# Patient Record
Sex: Female | Born: 1975 | Hispanic: Yes | Marital: Married | Smoking: Never smoker
Health system: Southern US, Community
[De-identification: ages and names within clinical notes are randomized; demographics above are authoritative.]

## PROBLEM LIST (undated history)

## (undated) DIAGNOSIS — M542 Cervicalgia: Secondary | ICD-10-CM

## (undated) DIAGNOSIS — G8929 Other chronic pain: Secondary | ICD-10-CM

## (undated) DIAGNOSIS — K2990 Gastroduodenitis, unspecified, without bleeding: Secondary | ICD-10-CM

## (undated) DIAGNOSIS — R109 Unspecified abdominal pain: Secondary | ICD-10-CM

## (undated) HISTORY — DX: Unspecified abdominal pain: R10.9

## (undated) HISTORY — DX: Other chronic pain: G89.29

---

## 2012-11-06 ENCOUNTER — Ambulatory Visit: Payer: Self-pay | Admitting: Gastroenterology

## 2013-01-21 ENCOUNTER — Encounter: Payer: Self-pay | Admitting: Internal Medicine

## 2013-02-05 ENCOUNTER — Encounter: Payer: Self-pay | Admitting: Gastroenterology

## 2013-02-05 ENCOUNTER — Ambulatory Visit (INDEPENDENT_AMBULATORY_CARE_PROVIDER_SITE_OTHER): Payer: Self-pay | Admitting: Gastroenterology

## 2013-02-05 VITALS — BP 105/74 | HR 64 | Temp 98.4°F | Ht 66.0 in | Wt 174.4 lb

## 2013-02-05 DIAGNOSIS — R109 Unspecified abdominal pain: Secondary | ICD-10-CM

## 2013-02-05 MED ORDER — OMEPRAZOLE 20 MG PO CPDR: 20.0000 mg | DELAYED_RELEASE_CAPSULE | Freq: Every day | ORAL | Status: DC

## 2013-02-05 NOTE — Progress Notes (Signed)
Primary Care Physician:  Tylene Fantasia., PA-C Primary Gastroenterologist:  Dr. Darrick Penna   Chief Complaint  Patient presents with  . Abdominal Pain    HPI:    [name] presents today at the request of Dr. Malvin Johns secondary to abdominal pain. She is Hispanic, and an interpreter is present for translating. She comes with records from a CT scan, pelvis ultrasound, and transvaginal ultrasound performed last year. CT only noted small umbilical hernia containing omental fat. Both ultrasounds unremarkable. UA normal from earlier this month, CBC normal.   States abdominal pain started on January 17, 2012. Intermittent, "deep pain", mid-abdominal/diffusely across abdomen. No vomiting, diarrhea. Rare episodes of constipation. No association with bowel habits. No association with eating. Notes decreased appetite with pain. Bloating across abdomen.   Then June 18, October 29, Nov 1, calm for 3 months, Feb 1. Will take Bentyl with improvement. No hematochezia, no melena. Good appetite, no significant weight loss.  No N/V, fever/chills. No GERD.   No past medical history on file.  Past Surgical History  Procedure Laterality Date  . Cesarean section      Current Outpatient Prescriptions  Medication Sig Dispense Refill  . dicyclomine (BENTYL) 20 MG tablet Take 20 mg by mouth every 6 (six) hours.      Marland Kitchen omeprazole (PRILOSEC) 20 MG capsule Take 1 capsule (20 mg total) by mouth daily.  30 capsule  3   No current facility-administered medications for this visit.    Allergies as of 02/05/2013  . (No Known Allergies)    Family History  Problem Relation Age of Onset  . Colon cancer Neg Hx     History   Social History  . Marital Status: Unknown    Spouse Name: N/A    Number of Children: N/A  . Years of Education: N/A   Occupational History  . Not on file.   Social History Main Topics  . Smoking status: Never Smoker   . Smokeless tobacco: Not on file  . Alcohol Use: No  . Drug  Use: No  . Sexually Active: Not on file   Other Topics Concern  . Not on file   Social History Narrative  . No narrative on file    Review of Systems: Gen: Denies any fever, chills, fatigue, weight loss, lack of appetite.  CV: Denies chest pain, heart palpitations, peripheral edema, syncope.  Resp: Denies shortness of breath at rest or with exertion. Denies wheezing or cough.  GI: Denies dysphagia or odynophagia. Denies jaundice, hematemesis, fecal incontinence. GU : Denies urinary burning, urinary frequency, urinary hesitancy MS: Denies joint pain, muscle weakness, cramps, or limitation of movement.  Derm: Denies rash, itching, dry skin Psych: Denies depression, anxiety, memory loss, and confusion Heme: Denies bruising, bleeding, and enlarged lymph nodes.  Physical Exam: BP 105/74  Pulse 64  Temp(Src) 98.4 F (36.9 C) (Oral)  Ht 5\' 6"  (1.676 m)  Wt 174 lb 6.4 oz (79.107 kg)  BMI 28.16 kg/m2  LMP 01/14/2013 General:   Alert and oriented. Pleasant and cooperative. Well-nourished and well-developed.  Head:  Normocephalic and atraumatic. Eyes:  Without icterus, sclera clear and conjunctiva pink.  Ears:  Normal auditory acuity. Nose:  No deformity, discharge,  or lesions. Mouth:  No deformity or lesions, oral mucosa pink.  Neck:  Supple, without mass or thyromegaly. Lungs:  Clear to auscultation bilaterally. No wheezes, rales, or rhonchi. No distress.  Heart:  S1, S2 present without murmurs appreciated.  Abdomen:  +BS, soft, non-tender and non-distended.  No HSM noted. No guarding or rebound. No masses appreciated.  Rectal:  Deferred  Msk:  Symmetrical without gross deformities. Normal posture. Extremities:  Without clubbing or edema. Neurologic:  Alert and  oriented x4;  grossly normal neurologically. Skin:  Intact without significant lesions or rashes. Cervical Nodes:  No significant cervical adenopathy. Psych:  Alert and cooperative. Normal mood and affect.

## 2013-02-05 NOTE — Patient Instructions (Addendum)
We will mail you the stool container to use for the stool sample.  Start taking Prilosec each morning, 30 minutes before breakfast. This is for your stomach.  Please complete the blood work once you have gotten the financial assistance through the hospital.  We will see you back in 4 weeks!

## 2013-02-06 DIAGNOSIS — R109 Unspecified abdominal pain: Secondary | ICD-10-CM | POA: Insufficient documentation

## 2013-02-06 NOTE — Assessment & Plan Note (Addendum)
Pleasant Spanish-speaking 37 year old female, presenting at the request of Dr. Malvin Johns due to diffuse abdominal pain, intermittent in pattern. It is interesting she is able to tell me exact calendar dates when pain occurs, but this is described as diffuse and without any aggravating or alleviating factors. There is no association with bowel habits, eating, drinking. No hematochezia or melena. Abdominal bloating associated with pain. No upper GI symptoms. Thus far, CT and pelvic ultrasounds unrevealing. Unclear etiology with differentials to include gastritis, possible biliary etiology, question a component of IBS although she does not note any association with bowel habits.   Check celiac serologies Ifobt Obtain financial assistance  Start Prilosec daily Consider [place] of abdomen to further evaluate gallbladder Monitor for any pattern with pain 4 week f/u with Dr. Darrick Penna.

## 2013-02-10 NOTE — Progress Notes (Signed)
Cc PCP 

## 2013-02-19 ENCOUNTER — Ambulatory Visit (INDEPENDENT_AMBULATORY_CARE_PROVIDER_SITE_OTHER): Payer: Self-pay | Admitting: Gastroenterology

## 2013-02-19 DIAGNOSIS — R109 Unspecified abdominal pain: Secondary | ICD-10-CM

## 2013-02-19 LAB — IFOBT (OCCULT BLOOD): IFOBT: POSITIVE

## 2013-02-27 NOTE — Progress Notes (Signed)
Quick Note:  Called. VM not set up. Mailed letter for pt to call. ______

## 2013-02-27 NOTE — Progress Notes (Signed)
Quick Note:  Heme positive.  Recommend colonoscopy (+/- EGD if evidence of dyspepsia). Pt needs to keep appt with Dr. Darrick Penna in June.  Complete celiac serologies as requested. ______

## 2013-02-28 ENCOUNTER — Other Ambulatory Visit: Payer: Self-pay | Admitting: Gastroenterology

## 2013-02-28 NOTE — Progress Notes (Signed)
I attempted to schedule but when I called the home no one there could speak English, so I have mailed a letter to the patient in hopes that someone that speaks English will call back to get her scheduled

## 2013-02-28 NOTE — Progress Notes (Signed)
Quick Note:  LATE ENTRY: Pt's niece , Josephina Gip, called about 4:45 yesterday afternoon. I informed her of the plan. She said she did not know the patient was supposed to do labs and so I faxed the orders to [facility] and she will see that she gets those done. She said that the pt was aware that she needed the colonoscopy and maybe an EGD. She said ok to schedule and She will be the one to take her. ______

## 2013-03-03 NOTE — Progress Notes (Signed)
Quick Note:  Gabriella Garcia mailed a letter for pt to call to get scheduled. ______

## 2013-03-04 ENCOUNTER — Other Ambulatory Visit: Payer: Self-pay | Admitting: Gastroenterology

## 2013-03-04 NOTE — Progress Notes (Signed)
Patients niece called and stated that Gabriella Garcia was waiting to hear back about her financial assistance before scheduling TCS +/-EGD

## 2013-03-27 ENCOUNTER — Ambulatory Visit (INDEPENDENT_AMBULATORY_CARE_PROVIDER_SITE_OTHER): Payer: Self-pay | Admitting: Gastroenterology

## 2013-03-27 ENCOUNTER — Encounter: Payer: Self-pay | Admitting: Gastroenterology

## 2013-03-27 VITALS — BP 109/74 | HR 69 | Temp 97.4°F | Ht 68.0 in | Wt 178.4 lb

## 2013-03-27 DIAGNOSIS — R1013 Epigastric pain: Secondary | ICD-10-CM

## 2013-03-27 DIAGNOSIS — G8929 Other chronic pain: Secondary | ICD-10-CM

## 2013-03-27 MED ORDER — OMEPRAZOLE 20 MG PO CPDR: DELAYED_RELEASE_CAPSULE | ORAL | Status: DC

## 2013-03-27 NOTE — Progress Notes (Signed)
  Subjective:    Patient ID: Gabriella Garcia, female    DOB: [birth year], 37 y.o.   MRN: [id number]  MUSE,ROCHELLE D., PA-C  INFORMATION OBTAINED VIA INTERPRETER-PT DOES NOT SPEAK ENGLISH.  HPI C/o bloating still. Pills didn't help. She took meds but didn't see a difference. BMs: 2-3 TIMES A DAY(#3). GOES TO SLEEP AND WAKES UP AND AROUND 2 PM IT STARTS. SHE DOESN'T FEEL LIKE SHE NEEDS TO LAY DOWN. BEEN IN [place] FOR 6 YEARS. BORN IN [place]. MILK: NONE CHEESE: NO. ICE CREAM: EVERY 8 DAYS. WEIGHT GAIN: 154 LBS 5 YEARS AGO AND 178 LBS. NO ASPIRIN, BC, GOODYS, IBUPROFEN/MORTIN, OR NAPROXEN/ALEVE OR ETOH. HAD A BABY/CSXN NOV 2012 AND 5 MOS LATER SHE HAD THE PAIN. NO WORK SINCE 2012 DUE TO CHILD BIRTH AND ABDOMINAL BLOATING  PT DENIES FEVER, CHILLS, BRBPR, nausea, vomiting, melena, diarrhea, constipation, abd pain, problems swallowing, problems with sedation, heartburn or indigestion.  Past medical history: NONE.  Past Surgical History  Procedure Laterality Date  . Cesarean section     No Known Allergies  Current Outpatient Prescriptions  Medication Sig Dispense Refill  . dicyclomine (BENTYL) 20 MG tablet Take 20 mg by mouth every 6 (six) hours.      Marland Kitchen omeprazole (PRILOSEC) 20 MG capsule Take 1 capsule (20 mg total) by mouth daily.     Family History  Problem Relation Age of Onset  . Colon cancer Neg Hx    History  Substance Use Topics  . Smoking status: Never Smoker   . Smokeless tobacco: Not on file  . Alcohol Use: No     Review of Systems PER HPI OTHERWISE ALL SYSTEMS ARE NEGATIVE.     Objective:   Physical Exam  Vitals reviewed. Constitutional: She is oriented to person, place, and time. She appears well-nourished. No distress.  HENT:  Head: Normocephalic and atraumatic.  Mouth/Throat: Oropharynx is clear and moist. No oropharyngeal exudate.  Eyes: Pupils are equal, round, and reactive to light. No scleral icterus.  Neck: Normal range of motion. Neck supple.   Cardiovascular: Normal rate, regular rhythm and normal heart sounds.   Pulmonary/Chest: Effort normal and breath sounds normal. No respiratory distress.  Abdominal: Soft. Bowel sounds are normal. She exhibits no distension. There is tenderness.  MILD TTP IN THE EPIGASTRIUM    Musculoskeletal: Normal range of motion. She exhibits no edema.  Lymphadenopathy:    She has no cervical adenopathy.  Neurological: She is alert and oriented to person, place, and time.  NO FOCAL DEFICITS   Psychiatric: She has a normal mood and affect.          Assessment & Plan:

## 2013-03-27 NOTE — Patient Instructions (Addendum)
YOUR PAIN IS MOST LIKELY DUE TO GASTRITIS.   Stop taking dicyclomine if it does not help your pain. USE TYLENOL AS NEEDED FOR PAIN  START OMEPRAZOLE TWICE DAILY AND CONTINUE FOR AT LEAST 3 MOS.  FOLLOW A  Dieta para el control del colesterol y las grasas . SEE INFO BELOW.  YOU NEED AN UPPER ENDOSCOPY. WE WILL SCHEDULE TODAY FOR 2 WEEKS FROM NOW.  FOLLOW UP IN 2 MOS.   Gastritis - Adultos  (Gastritis, Adult)  La gastrittis es la irritacin (inflamacin) de la membrana interna del estmago. Puede ser Neomia Dear enfermedad de inicio sbito (aguda) o de largo plazo (crnica). Si la gastritis no se trata, puede causar sangrado y lceras. CAUSAS  La gastritis se produce cuando la membrana que tapiza interiormente al estmago se debilita o se daa. Los jugos digestivos del estmago inflaman el revestimiento del estmago debilitado. El revestimiento del estmago puede debilitarse o daarse por una infeccin viral o bacteriana. La infeccin bacteriana ms comn es la infeccin por Helicobacter pylori. Tambin puede ser el resultado del consumo excesivo de alcohol, por el uso de ciertos medicamentos o porque hay demasiado cido en el estmago.  SNTOMAS  En algunos casos no hay sntomas. Si se presentan sntomas, stos pueden ser:   Dolor o sensacin de ardor en la parte superior del abdomen.  Nuseas.  Vmitos.  Sensacin molesta de distensin despus de comer. DIAGNSTICO  El mdico puede diagnosticar gastritis segn los sntomas y el examen fsico. Para determinar la causa de la gastritis, el mdico podr:   Pedir anlisis de sangre o de materia fecal para diagnosticar la presencia de la bacteria H pylori.  Gastroscopa. Un tubo delgado y flexible (endoscopio) se pasa por Theatre stage manager al [employer]. El endoscopio tiene [place] luz y una cmara en el extremo. El mdico utilizar el endoscopio para observar el interior del [place].  Tomar una muestra de tejido (biopsia) del estmago para  examinarlo en el microscopio. TRATAMIENTO  Segn la causa de la gastritis podrn recetarle: Antibiticos, si la causa es una infeccin bacteriana, como una infeccin por H. pylori. Anticidos o bloqueadores H2, si hay demasiado cido en el estmago. El [employer] aconsejar que deje de tomar aspirina, ibuprofeno u otros antiinflamatorios no esteroides (AINE).  INSTRUCCIONES PARA EL CUIDADO EN EL HOGAR   Tome slo medicamentos de venta libre o recetados, segn las indicaciones del mdico.  Si le han recetado antibiticos, tmelos segn las indicaciones. Tmelos todos, aunque se sienta mejor.  Debe ingerir gran cantidad de lquido para mantener la orina de tono claro o color amarillo plido.  Evite las comidas y bebidas que [address] [place], [place]:  [place] con cafena o alcohlicas.  Chocolate.  Sabores a Advertising account planner.  Ajo y cebolla.  Comidas muy condimentadas.  Ctricos como naranjas, limones o limas.  Alimentos que contengan tomate, como salsas, [place] y pizza.  Alimentos fritos y Lexicographer.  Haga comidas pequeas durante Glass blower/designer de 3 comidas abundantes. SOLICITE ATENCIN MDICA DE INMEDIATO SI:   La materia fecal es negra o de color rojo oscuro.  Vomita sangre de color rojo brillante o material similar a granos de caf.  No puede retener los lquidos.  El dolor abdominal empeora.  Tiene fiebre.  No mejora luego de 1 semana.  Tiene preguntas o preocupaciones. ASEGRESE DE QUE:   Comprende estas instrucciones.  Controlar su enfermedad.  Solicitar ayuda de inmediato si no mejora o si empeora. Document Released: 07/12/2005 Document Revised: 06/26/2012 ExitCare  Patient Information 2014 [place], [place].     Dieta para el control del colesterol y las grasas  (Fat and Cholesterol Control Diet) El colesterol es una sustancia similar a la cera. Se forma en el hgado y se encuentra en ciertos alimentos. Hay colesterol bueno (HDL) y colesterol malo (LDL). El  exceso de colesterol en la sangre puede afectar su corazn. Ciertos alimentos pueden disminuir o aumentar el nivel de colesterol. Consuma alimentos bajos en colesterol.  Las grasas saturadas y trans son grasas malas que se encuentran en los alimentos y elevan el nivel de colesterol. No  Consuma alimentos altos en grasas saturadas y grasas trans.  ALIMENTOS CON ALTO CONTENIDO EN GRASAS SATURADAS Y GRASAS TRANS   Los productos lcteos como la [address] entera [place], [place], [place], crema y [place].  Carnes grasas, tales como chorizos, salchichas y salami.  Comidas fritas.  Las grasas trans que se encuentran en la margarina y las galletas pre hechas, galletitas y productos de [place].  Los [employer], como los aceites de coco y de [place]. Lea la etiqueta del envase en la tienda. No compre productos que contengan grasas saturadas, grasas trans o aceites hidrogenados. Busque alimentos rotulados como:   Nature conservation officer.  Bajo en grasas saturadas.  Libre de [place] trans.  Bajo en colesterol. ALIMENTOS QUE DISMINUYEN EL COLESTEROL   Frutas.  Vegetales.  Porotos, guisantes secos y lentejas.  Pescado.  Carne magra como pollo (sin piel) o carne molida de [place].  Granos como cebada, arroz, cuscus, trigo bulgur y pastas.  Margarina de tubo saludable para el corazn. PREPARE SUS ALIMENTOS USTED MISMO   Cocine los alimentos hervidos, horneados, al vapor o asados. No cocine los alimentos en fritura.  Utilice un spray antiadherente para cocinar.  Use limn o hierbas para condimentar comidas en lugar de usar mantequilla o margarina.  Use yogur descremados, salsas o aderezos para ensaladas con bajo contenido de [place]. Document Released: 04/02/2012 [facility] Patient Information 2014 [place], [place].

## 2013-03-28 DIAGNOSIS — R1013 Epigastric pain: Secondary | ICD-10-CM | POA: Insufficient documentation

## 2013-03-28 DIAGNOSIS — G8929 Other chronic pain: Secondary | ICD-10-CM | POA: Insufficient documentation

## 2013-03-28 NOTE — Assessment & Plan Note (Addendum)
MOST LIKELY DUE TO H PYLORI GASTRITIS, DOUBT GASTRIC CA, MALT LYMPHOMA, OR EOSINOPHILIC GASTROENETRITIS  I REVIEWED IMAGING FROM 2013 TO PRESENT.  Stop taking dicyclomine if it does not help your pain. USE TYLENOL AS NEEDED FOR PAIN START OMEPRAZOLE TWICE DAILY AND CONTINUE FOR AT LEAST 3 MOS. FOLLOW A  Dieta para el control del colesterol y las grasas . SEE INFO BELOW. UPPER ENDOSCOPY IN 2 WEEKS FROM NOW. WILL CHECK ON [facility].  FOLLOW UP IN 2 MOS.

## 2013-03-28 NOTE — Progress Notes (Signed)
CC PCP 

## 2013-03-31 ENCOUNTER — Telehealth: Payer: Self-pay

## 2013-03-31 NOTE — Telephone Encounter (Signed)
Barbara from [facility] left VM that pt's application has expired and needs to be updated. I have tried to call Britta Mccreedy back and the lines are so busy and I have been put on hold and just had to wait to try again. She said something in VM that if pt has interpreter they can update some info, I need to talk to her and find out if she needs to do more paper work.

## 2013-03-31 NOTE — Telephone Encounter (Signed)
Called pt's niece, Josephina Gip and she said they did the paperwork in April. I told her to call [phone] and ask to speak to [facility] or [place], and they could tell her what they need to do.

## 2013-03-31 NOTE — Telephone Encounter (Signed)
Spoke to [place] at [facility] and she said if pt can have someone to interpret for her they need to ask questions to see if she qualifies for new application.

## 2013-04-02 NOTE — Progress Notes (Signed)
OV MADE °

## 2013-04-11 ENCOUNTER — Encounter (HOSPITAL_COMMUNITY): Payer: Self-pay | Admitting: *Deleted

## 2013-04-11 ENCOUNTER — Ambulatory Visit (HOSPITAL_COMMUNITY)
Admission: RE | Admit: 2013-04-11 | Discharge: 2013-04-11 | Disposition: A | Discharge status: Home / Self Care | Payer: Self-pay | Source: Ambulatory Visit | Attending: Gastroenterology | Admitting: Gastroenterology

## 2013-04-11 ENCOUNTER — Encounter (HOSPITAL_COMMUNITY)
Admission: RE | Disposition: A | Discharge status: Home / Self Care | Payer: Self-pay | Source: Ambulatory Visit | Attending: Gastroenterology

## 2013-04-11 DIAGNOSIS — K2990 Gastroduodenitis, unspecified, without bleeding: Secondary | ICD-10-CM

## 2013-04-11 DIAGNOSIS — R1013 Epigastric pain: Secondary | ICD-10-CM | POA: Insufficient documentation

## 2013-04-11 DIAGNOSIS — K2970 Gastritis, unspecified, without bleeding: Secondary | ICD-10-CM

## 2013-04-11 DIAGNOSIS — K2980 Duodenitis without bleeding: Secondary | ICD-10-CM

## 2013-04-11 HISTORY — PX: ESOPHAGOGASTRODUODENOSCOPY: SHX5428

## 2013-04-11 SURGERY — EGD (ESOPHAGOGASTRODUODENOSCOPY)
Anesthesia: Moderate Sedation

## 2013-04-11 MED ORDER — MIDAZOLAM HCL 5 MG/5ML IJ SOLN
INTRAMUSCULAR | Status: DC | PRN
  Administered 2013-04-11 (×3): 2 mg via INTRAVENOUS

## 2013-04-11 MED ORDER — SODIUM CHLORIDE 0.9 % IV SOLN
INTRAVENOUS | Status: DC
  Administered 2013-04-11: 11:00:00 via INTRAVENOUS

## 2013-04-11 MED ORDER — MIDAZOLAM HCL 5 MG/5ML IJ SOLN
INTRAMUSCULAR | Status: AC
  Filled 2013-04-11: qty 10

## 2013-04-11 MED ORDER — BUTAMBEN-TETRACAINE-BENZOCAINE 2-2-14 % EX AERO
INHALATION_SPRAY | CUTANEOUS | Status: DC | PRN
  Administered 2013-04-11: 2 via TOPICAL

## 2013-04-11 MED ORDER — MEPERIDINE HCL 100 MG/ML IJ SOLN
INTRAMUSCULAR | Status: DC | PRN
  Administered 2013-04-11 (×2): 25 mg via INTRAVENOUS

## 2013-04-11 MED ORDER — MEPERIDINE HCL 100 MG/ML IJ SOLN
INTRAMUSCULAR | Status: AC
  Filled 2013-04-11: qty 2

## 2013-04-11 NOTE — H&P (View-Only) (Signed)
  Subjective:    Patient ID: Gabriella Garcia, female    DOB: December 10, 1975, 37 y.o.   MRN: [id number]  MUSE,ROCHELLE D., PA-C  INFORMATION OBTAINED VIA INTERPRETER-PT DOES NOT SPEAK ENGLISH.  HPI C/o bloating still. Pills didn't help. She took meds but didn't see a difference. BMs: 2-3 TIMES A DAY(#3). GOES TO SLEEP AND WAKES UP AND AROUND 2 PM IT STARTS. SHE DOESN'T FEEL LIKE SHE NEEDS TO LAY DOWN. BEEN IN [place] FOR 6 YEARS. BORN IN [place]. MILK: NONE CHEESE: NO. ICE CREAM: EVERY 8 DAYS. WEIGHT GAIN: 154 LBS 5 YEARS AGO AND 178 LBS. NO ASPIRIN, BC, GOODYS, IBUPROFEN/MORTIN, OR NAPROXEN/ALEVE OR ETOH. HAD A BABY/CSXN NOV 2012 AND 5 MOS LATER SHE HAD THE PAIN. NO WORK SINCE 2012 DUE TO CHILD BIRTH AND ABDOMINAL BLOATING  PT DENIES FEVER, CHILLS, BRBPR, nausea, vomiting, melena, diarrhea, constipation, abd pain, problems swallowing, problems with sedation, heartburn or indigestion.  Past medical history: NONE.  Past Surgical History  Procedure Laterality Date  . Cesarean section     No Known Allergies  Current Outpatient Prescriptions  Medication Sig Dispense Refill  . dicyclomine (BENTYL) 20 MG tablet Take 20 mg by mouth every 6 (six) hours.      Marland Kitchen omeprazole (PRILOSEC) 20 MG capsule Take 1 capsule (20 mg total) by mouth daily.     Family History  Problem Relation Age of Onset  . Colon cancer Neg Hx    History  Substance Use Topics  . Smoking status: Never Smoker   . Smokeless tobacco: Not on file  . Alcohol Use: No     Review of Systems PER HPI OTHERWISE ALL SYSTEMS ARE NEGATIVE.     Objective:   Physical Exam  Vitals reviewed. Constitutional: She is oriented to person, place, and time. She appears well-nourished. No distress.  HENT:  Head: Normocephalic and atraumatic.  Mouth/Throat: Oropharynx is clear and moist. No oropharyngeal exudate.  Eyes: Pupils are equal, round, and reactive to light. No scleral icterus.  Neck: Normal range of motion. Neck supple.   Cardiovascular: Normal rate, regular rhythm and normal heart sounds.   Pulmonary/Chest: Effort normal and breath sounds normal. No respiratory distress.  Abdominal: Soft. Bowel sounds are normal. She exhibits no distension. There is tenderness.  MILD TTP IN THE EPIGASTRIUM    Musculoskeletal: Normal range of motion. She exhibits no edema.  Lymphadenopathy:    She has no cervical adenopathy.  Neurological: She is alert and oriented to person, place, and time.  NO FOCAL DEFICITS   Psychiatric: She has a normal mood and affect.          Assessment & Plan:

## 2013-04-11 NOTE — Op Note (Signed)
[facility] [address] [place], [address]   ENDOSCOPY PROCEDURE REPORT  PATIENT: Gabriella Garcia, Gabriella Garcia  MR#: [id number] BIRTHDATE: 11/02/1975 , 36  yrs. old GENDER: Female  ENDOSCOPIST: Jonette Eva, MD REFERRED XB:[id number] Muse, PA  PROCEDURE DATE: 04/11/2013 PROCEDURE:   EGD w/ biopsy  INDICATIONS:Epigastric pain. PAIN IMPROVED WITH OMP BID. HX VIA [phone] MEDICATIONS: Demerol 50 mg IV and Versed 6 mg IV TOPICAL ANESTHETIC:   Cetacaine Spray  DESCRIPTION OF PROCEDURE:     Physical exam was performed.  Informed consent was obtained from the patient after explaining the benefits, risks, and alternatives to the procedure.  The patient was connected to the monitor and placed in the left lateral position.  Continuous oxygen was provided by nasal cannula and IV medicine administered through an indwelling cannula.  After administration of sedation, the patients esophagus was intubated and the [id number] ([id number])  endoscope was advanced under direct visualization to the second portion of the duodenum.  The scope was removed slowly by carefully examining the color, texture, anatomy, and integrity of the mucosa on the way out.  The patient was recovered in endoscopy and discharged home in satisfactory condition.   ESOPHAGUS: The mucosa of the esophagus appeared normal.   STOMACH: Mild non-erosive gastritis (inflammation) was found in the gastric antrum.  Multiple biopsies were performed using cold forceps. DUODENUM: Mild duodenal inflammation was found in the duodenal bulb.   The duodenal mucosa showed no abnormalities in the 2nd part of the duodenum.  COMPLICATIONS:   None  ENDOSCOPIC IMPRESSION: 1.   ABDOMINL PAIN DUE TO gastritis & DUODENITIS  RECOMMENDATIONS: AWAIT BIOPSY OMEPRAZOLE BID LOW FAT DIET OPV AUG 2014   REPEAT EXAM:   _______________________________ Rosalie DoctorJonette Eva, MD 04/11/2013 12:47 PM

## 2013-04-11 NOTE — Interval H&P Note (Signed)
History and Physical Interval Note:  04/11/2013 11:09 AM  Gabriella Garcia  has presented today for surgery, with the diagnosis of Abdominal Pain  The various methods of treatment have been discussed with the patient and family. After consideration of risks, benefits and other options for treatment, the patient has consented to  Procedure(s) with comments: ESOPHAGOGASTRODUODENOSCOPY (EGD) (N/A) - 11:15-moved to 11:25 [name] to notify pt as a surgical intervention .  The patient's history has been reviewed, patient examined, no change in status, stable for surgery.  I have reviewed the patient's chart and labs.  Questions were answered to the patient's satisfaction.     [employer]

## 2013-04-15 ENCOUNTER — Encounter (HOSPITAL_COMMUNITY): Payer: Self-pay | Admitting: Gastroenterology

## 2013-04-16 ENCOUNTER — Telehealth: Payer: Self-pay | Admitting: Gastroenterology

## 2013-04-16 NOTE — Telephone Encounter (Signed)
Please call pt. HER stomach Bx shows gastritis.   CONTINUE OMEPRAZOLE.  TAKE 30 MINUTES PRIOR TO YOUR MEALS TWICE DAILY. FOLLOW A LOW FAT DIET.  FOLLOW UP AUG 2014.

## 2013-04-17 NOTE — Telephone Encounter (Signed)
Cc PCP 

## 2013-04-17 NOTE — Telephone Encounter (Signed)
Mailed the printout of the info.

## 2013-04-17 NOTE — Telephone Encounter (Signed)
No one there that speaks english

## 2013-04-21 NOTE — Telephone Encounter (Signed)
Pt's interpreter and niece, Josephina Gip, called for her results. I went over them with her. Pt has follow up appt on 05/22/2013 with SF. Marian Sorrow would like a return call from Dr. Darrick Penna at [phone]  Around 4:00 pm one afternoon when she returns from vacation. She said it was in reference to something DR. Fields talked to them about at the hospital. Not urgent, but would appreciate a call when she can.

## 2013-04-30 NOTE — Telephone Encounter (Signed)
Called patient'S ADVOCATE TO DISCUSS RESULTS. EXPLAINED RESULTS AND MANAGEMENT. PT DOING WELL. ANSWERED QUESTION. SHE WILL CALL WITH QUESTIONS

## 2013-05-14 ENCOUNTER — Encounter: Payer: Self-pay | Admitting: Gastroenterology

## 2013-05-22 ENCOUNTER — Encounter: Payer: Self-pay | Admitting: Gastroenterology

## 2013-05-22 ENCOUNTER — Other Ambulatory Visit: Payer: Self-pay | Admitting: Gastroenterology

## 2013-05-22 ENCOUNTER — Ambulatory Visit (INDEPENDENT_AMBULATORY_CARE_PROVIDER_SITE_OTHER): Payer: Self-pay | Admitting: Gastroenterology

## 2013-05-22 VITALS — BP 113/73 | HR 59 | Temp 98.0°F | Wt 172.8 lb

## 2013-05-22 DIAGNOSIS — G8929 Other chronic pain: Secondary | ICD-10-CM

## 2013-05-22 DIAGNOSIS — R1013 Epigastric pain: Secondary | ICD-10-CM

## 2013-05-22 MED ORDER — OMEPRAZOLE 20 MG PO CPDR: DELAYED_RELEASE_CAPSULE | ORAL | Status: DC

## 2013-05-22 NOTE — Patient Instructions (Addendum)
CONTINUE PRILOSEC EVERY MORNING WITH BREAKFAST.  COLONOSCOPY AUG 19.  FOLLOW A HIGH FIBER/LOW FAT DIET. SEE INFO BELOW.  USE BENTYL(DICYCLOMINE) ONE OR TWO WHEN PAIN STARTS AND REPEAT IN 1 HOUR IF PAIN DOES NOT RESOLVE.  USE FIBER POWDER OR 1 PACKET ONCE DAILY FOR 3 DAYS THEN TWICE DAILY FOR 3 DAYS THEN THREE TIMES A DAY.  USE TYLENOL, IBUPROFEN, OR NAPROXEN AS NEED FOR ABDOMINAL PAIN. TAKE MEDS WITH FOOD OR MILK.  DRINK WATER TO KEEP YOUR URINE LIGHT YELLOW.  FOLLOW UP IN 3 MOS.   Sndrome de colon irritable (Irritable Bowel Syndrome) El sndrome de colon irritable se origina en un trastorno de la funcin normal del intestino Tambin se lo denomina colon espstico, colitis mucosa y colon irritable. No es necesario realizar un tratamiento quirrgico, ni es un problema que pueda transformarse en cncer. No hay cura para este sndrome. Pero con Neomia Dear dieta apropiada, reduccin del estrs y Tourist information centre manager, usted notar que sus problemas (sntomas) gradualmente desaparecern o mejorarn. Se trata de una enfermedad frecuente del aparato digestivo. Aparece con frecuencia a finales de la adolescencia o comienzos de la vida adulta., La proporcin de mujeres que sufren este trastorno es del doble que los hombres. CAUSAS Luego que el alimento es digerido y absorbido en el intestino delgado, Animator de desecho se dirige hacia el colon (intestino grueso). En el colon se absorben el agua y las sales que provienen de los alimentos no digeridos que se encuentran en el intestino delgado. Los residuos que [place], o materia fecal, se retiene para su posterior eliminacin. Bajo circunstancias normales, ciertas contracciones suaves y rtmicas de las paredes del intestino empujan la materia fecal a lo largo del colon, hacia el recto. Sin embargo, cuando se presenta este problema, estas contracciones son irregulares y no coordinadas. Entonces ocurre que la materia fecal se retiene por un perodo prolongado, lo que  origina constipacin, o se expele demasiado rpido, lo que produce diarrea. SNTOMAS El sntoma ms frecuente es Chief Technology Officer. Lo ms frecuente es que el dolor se localice en la zona izquierda inferior del vientre (abdomen). Pero puede ocurrir en cualquier rea del abdomen. Puede sentirse como acidez de [place], Engineer, mining de espalda o Teacher, adult education un dolor sordo en los brazos o en los hombros. El dolor se origina en espasmos excesivos de los msculos del intestino y de la acumulacin de gases y materia fecal en el colon. Este dolor:  Puede oscilar entre clicos agudos en el vientre (abdomen) hasta un dolor sordo y continuo.  Generalmente empeora luego de comer.  Es caracterstico que se alivie al mover el intestino o [employer] gases. Generalmente se acompaa de constipacin. Pero tambin puede [employer] Inc. La diarrea se produce inmediatamente despus de comer o al levantarse por la maana. Las heces son blandas y International aid/development worker. Con frecuencia estn mezcladas con secreciones (mucus). Otros sntomas son:  Product manager  [employer]  Prdida del apetito  Ganas de vomitar (nuseas)  Vmitos Esta enfermedad tambin puede causar otros sntomas que no se relacionan con el sistema digestivo.  Fatiga.  Estados de ansiedad  Dificultades de Librarian, academic.  Dolor de [place].  Falta de [employer]. Estos sntomas tienden a Research officer, trade union y Geneticist, molecular. DIAGNSTICO Los sntomas se asemejan a los de otras enfermedades ms graves del aparato digestivo. Por lo tanto el profesional que lo asiste le indicar una serie adicional de anlisis para descartarlas. Debe estar seguro de hallar la causa (diagnostico) En este caso, se le explicar la  naturaleza y el propsito de cada prueba. TRATAMIENTO Existe un buen nmero de medicamentos disponibles que ayudan a corregir el funcionamiento intestinal y/o a Occupational hygienist intestinales y Chief Technology Officer abdominal. Los medicamentos disponibles son:  Jodi Marble, no irritantes para los casos de constipacin grave y para Contractor a Dietitian.  Medicamentos antidiarreicos especficos para tratar los [place] graves o prolongados de [place].  Antiespasmdicos para Copywriter, advertising.  El profesional que lo asiste tambin podr indicarle un tranquilizante o sedante suave durante los perodos extraordinarios de estrs en su vida. Lo ms importante es recordar que si le prescriben un medicamento, deber tomarlo exactamente como se lo han indicado. Hgale saber al profesional que lo asiste si ha obtenido alivio al tomarlo. INSTRUCCIONES PARA EL CUIDADO DOMICILIARIO  Evite los alimentos ricos en grasas o aceites. Por ejemplo, crema, manteca, salchichas, embutidos y otras comidas grasas.  Evite los alimentos que puedan tener efectos laxantes, como frutas, jugos de fruta y productos lcteos.  Elimine las bebidas gaseosas, la goma de [place] y los alimentos que producen gases como frijoles y col. Esto ayuda a Technical sales engineer hinchazn y los eructos.  Consuma salvado con gran cantidad de lquidos puede ayudar a combatir la constipacin.  Observe cules son los alimentos que originan sus sntomas.  Evite las situaciones de gran carga emocional o las circunstancias que producen ansiedad.  Comience o contine con un plan de ejercicios.  Descanse y duerma lo suficiente.  Dieta con alto contenido de fibra  (High [employer] Diet) La fibra se encuentra en frutas, verduras y granos. Una dieta con alto contenido en fibras se favorece con la adicin de ms granos enteros, legumbres, frutas y verduras en su dieta. La cantidad recomendada de fibra para los hombres adultos es de 38 g por da. Para las mujeres adultas es de 25 g por da. Las [employer] Corporation y las que amamantan deben consumir 28 gramos de fibra por Futures trader. Si usted tiene un problema digestivo o intestinal, consulte a su mdico antes de la adicin de alimentos  ricos en fibra a su dieta. Coma una variedad de alimentos ricos en fibra en lugar de slo unos pocos.  OBJETIVO   Aumentar la masa fecal.  Tener deposiciones ms regulares para evitar el estreimiento.  Reducir el colesterol.  Para evitar comer en exceso. CUANDO SE UTILIZA ESTA DIETA?   En caso de estreimiento y hemorroides.  En caso de diverticulosis no complicada (enfermedad intestinal) y en el sndrome del colon irritable.  Si necesita ayuda para el control de [place].  Si desea mejorar su dieta como medida de proteccin contra la aterosclerosis, la diabetes y Management consultant. FUENTES DE FIBRA   Panes y cereales integrales.  Frutas, como las [place], [place], pltanos, fresas, Environmental manager y peras.  Verduras, como guisantes, zanahorias, batatas, remolachas, brcoli, repollo, espinacas y alcauciles.  Legumbres, las arvejas, soja, lentejas.  Almendras. CONTENIDO DE FIBRA DE LOS ALIMENTOS  Almidones y granos / Media planner (g)   Cheerios, 1 taza / 3 g  Corn Flakes, 1 taza / 0,7 g  Arroz inflado, 1  tazas / 0,3 g  Harina de avena instantnea (cocida),  taza / 2 g  Cereal de trigo escarchado, 1 taza / 5,1 g  Arroz marrn grano largo (cocido), 1 taza / 3,5 g  Arroz blanco grano largo (cocido), 1 taza / 0,6 g  Macarrones enriquecidos (cocidos), 1 taza / 2,5 g Legumbres / Fibra Diettica (g)   Frijoles cocidos (enlatados,  crudos o vegetarianos),  taza / 5,2 g  Frijoles (enlatados),  taza / 6,8 g  Frijoles pintos (cocidos),  taza / 5,5 g Panes y galletas / Media planner (g)   Galletas de graham o miel, 2 plazas / 0,7 g  Galletitas saladas, 3 unidades / 0,3 g  Pretzels salados comunes, 10 pedazos / 1,8 g  Pan integral, 1 rebanada / 1,9 g  Pan blanco, 1 rebanada / 0,7 g  Pan con pasas, 1 rebanada / 1,2 g  Bagel 3 oz / 2 g  Tortilla de harina, 1 oz / 0.9 g  Tortilla de maz, 1 pequea / 1,5 g  Pan de amburguesa o hot dog, 1 pequeo / 0,9 g Frutas /  Fibra Diettica (g)   Manzana con piel, 1 mediana / 4,4 g  Pur de [employer],  taza / 1,5 g  [place],  mediano / 1,5 g  Uvas, 10 uvas / 0,4 g  Naranja, 1 pequea / 2,3 g  Pasas, 1,5 oz / 1.6 g  Meln, 1 taza / 1,4 g Vegetales / Fibra Diettica (g)   Judas verdes (en conserva),  taza / 1,3 g  Zanahorias (cocido),  taza / 2,3 g  Broccoli (cocido),  taza / 2,8 g  Guisantes (cocidos),  taza / 4,4 g  Pur de papas,  taza / 1,6 g  Lechuga, 1 taza / 0,5 g  Maz (en lata),  taza / 1,6 g  Tomate,  taza / 1,1 g 1 cup / 3 g. Document Released: 10/02/2005 Document Revised: 04/02/2012 [facility] Patient Information 2014 [place], [place].  Dieta para el control del colesterol y las grasas  (Fat and Cholesterol Control Diet) Los niveles de colesterol en el organismo estn determinados significativamente por su dieta. Los niveles de colesterol tambin se relacionan con la enfermedad cardaca. El material que sigue ayuda a Software engineer relacin y a Chiropractor qu puede hacer para mantener su corazn sano. No todo el colesterol es [place]. Las lipoprotenas de baja densidad (LDL) forman el colesterol "malo". El colesterol malo puede ocasionar depsitos de grasa que se acumulan en el interior de las arterias. Las lipoprotenas de alta densidad (HDL) es el colesterol "bueno". Ayuda a remover el colesterol LDL "malo" de la Reader. El colesterol es un factor de riesgo muy importante para la enfermedad cardaca. Otros factores de riesgo son la hipertensin arterial, el hbito de fumar, el estrs, la herencia y [place].  El msculo cardaco obtiene el suministro de sangre a travs de las arterias coronarias. Si su colesterol LDL ("malo") est elevado y el HDL ("bueno") es bajo, tiene un factor de riesgo para que se formen depsitos de Holiday representative en las arterias coronarias (los vasos sanguneos que suministran sangre al corazn). Esto hace que haya menos lugar para que la sangre circule. Sin la  suficiente sangre y oxgeno, el msculo cardaco no puede funcionar correctamente, y usted podr sentir dolores en el pecho (angina pectoris). Cuando una arteria coronaria se cierra completamente, una parte del msculo cardaco puede morir (infarto de miocardio). CONTROL DEL COLESTEROL Cuando el profesional que lo asiste enva la sangre al laboratorio para Artist nivel de colesterol, puede realizarle tambin un perfil completo de los lpidos. Con esta prueba, se puede determinar la cantidad total de colesterol, as como los niveles de LDL y HDL. Los triglicridos son un tipo de grasas que circulan por la sangre. Tambin pueden usarse para determinar el riesgo de enfermedad cardaca. En la siguiente tabla se establecen los nmeros  ideales: Prueba: Colesterol total  Menos de 200 mg/dl. Prueba: LDL "colesterol malo"  Menos de 100 mg/dl.  Menos de 70 mg/dl si tiene riesgo muy elevado de sufrir un ataque cardaco o muerte cardaca sbita. Prueba: HDL "colesterol bueno"  Mujeres: Ms de 50 mg/dl.  Hombres: Ms de 40 mg/dl. Prueba: Trigliceridos  Menos de 150 mg/dl. CONTROL DEL COLESTEROL CON DIETA Aunque factores como el ejercicio y el estilo de vida son importantes, la "primera lnea de ataque" es la dieta. Esto se debe a que se sabe que ciertos alimentos hacen subir el colesterol y otros lo [place]. El objetivo debe ser [employer] alimentos, de modo que tengan un efecto sobre el colesterol y, an ms importante, Microbiologist las grasas saturadas y trans con otros tipos de grasas, como las monoinsaturadas y las poliinsaturadas y cidos grasos omega-3 . En promedio, una persona no debe consumir ms de 15 a 17 g de grasas saturadas por [employer]. Las grasas saturadas y trans se consideran grasas "malas", ya que elevan el colesterol LDL. Las grasas saturadas se encuentran principalmente en productos animales como carne, [place] y crema. Pero esto no significa que usted Marketing executive todas sus comidas  favoritas. Actualmente, como lo muestra el cuadro que figura al final de este documento, hay sustitutos de buen sabor, bajos en grasas y en colesterol, para la mayora de los alimentos que a usted Musician. Elija aquellos alimentos alternativos que sean bajos en grasas o sin grasas. Elija cortes de carne del cuarto trasero o lomo ya que estos cortes son los que tienen menor cantidad de grasa y Oncologist. El pollo (sin piel), el pescado, la carne de ternera, y la [place] de [place] molida son excelentes opciones. Elimine las carnes [employer] Foods o el salami. Los [employer] o nada de grasas saturadas. Cuando consuma carne [place], carne de aves de corral, o pescado, hgalo en porciones de 85 gramos (3 onzas). Las grasas trans tambin se llaman "aceites parcialmente hidrogenados". Son aceites manipulados cientficamente de [place] que son slidos a Publishing rights manager, tienen una larga vida y Glass blower/designer sabor y la textura de los alimentos a los que se Scientist, clinical (histocompatibility and immunogenetics). Las grasas trans se encuentran en la [place], [place], crackers y alimentos horneados.  Para hornear y cocinar, el aceite es un excelente sustituto para la [place]. Los aceites monoinsaturados tienen un beneficio particular, ya que se cree que disminuyen el colesterol LDL (colesterol malo) y elevan el HDL. Deber evitar los aceites tropicales saturados como el de coco y el de [place].  Recuerde, adems, que puede comer sin restricciones los grupos de alimentos que son naturalmente libres de grasas saturadas y Neurosurgeon trans, entre los que se incluyen el pescado, las frutas (excepto el [place]), verduras, frijoles, cereales (cebada, arroz, [place], trigo) y las pastas (sin salsas con crema)  IDENTIFIQUE LOS ALIMENTOS QUE DISMINUYEN EL COLESTEROL  Pueden disminuir el colesterol las fibras solubles que estn en las frutas, como las [place], en los vegetales como el brcoli, las patatas y las zanahorias; en las legumbres como frijoles,  guisantes y Therapist, occupational; y en los cereales como la cebada. Los alimentos fortificados con fitosteroles tambin Engineer, production. Debe consumir al menos 2 g de estos alimentos a diario para Financial planner de disminucin de [place].  En el supermercado, lea las etiquetas de los envases para identificar los alimentos bajos en grasas saturadas, libres de grasas trans y bajos en [place], . Elija quesos que tengan solo de 2 a 3  g de grasa saturada por onza (28,35 g). Use una margarina que no dae el corazn, [place] de grasas trans o aceite parcialmente hidrogenado. Al comprar alimentos horneados (galletitas dulces y Gaffer) evite el aceite parcialmente hidrogenado. Los panes y bollos debern ser de granos enteros (harina de maz o de avena entera, en lugar de "harina" o "harina enriquecida"). Compre sopas en lata que no sean cremosas, con bajo contenido de sal y sin grasas adicionadas.  TCNICAS DE PREPARACIN DE LOS ALIMENTOS  Nunca fra los alimentos en aceite abundante. Si debe frer, hgalo en poco aceite y removiendo [place], porque as se utilizan muy pocas grasas, o utilice un spray antiadherente. Cuando le sea posible, hierva, hornee o ase las carnes y cocine los vegetales al vapor. En vez de [employer] con mantequilla o [place], utilice limn y hierbas, pur de Psychologist, educational y canela (para las calabazas y batatas), yogurt y salsa descremados y aderezos para ensaladas bajos en contenido graso.  BAJO EN GRASAS SATURADAS / SUSTITUTOS BAJOS EN GRASA  [place] / Grasas saturadas (g)  Evite: Bife, corte graso (3 oz/85 g) / 11 g  Elija: Bife, corte magro (3 oz/85 g) / 4 g  Evite: Hamburguesa (3 oz/85 g) / 7 g  Elija:  [place] magra (3 oz/85 g) / 5 g  Evite: Jamn (3 oz/85 g) / 6 g  Elija:  Jamn magro (3 oz/85 g) / 2.4 g  Evite: Pollo, con piel (3 oz/85 g), Carne oscura / 4 g  Elija:  Pollo, sin piel (3 oz/85 g), Carne oscura / 2 g  Evite: Pollo, con piel (3  oz/85 g), Carne magra / 2.5 g  Elija: Pollo, sin piel (3 oz/85 g), Carne magra / 1 g Lcteos / Grasas saturadas (g)  Evite: Leche entera (1 taza) / 5 g  Elija: Leche con bajo contenido de grasa, 2% (1 taza) / 3 g  Elija: Leche con bajo contenido de grasa, 1% (1 taza) / 1.5 g  Elija: Leche descremada (1 taza) / 0.3 g  Evite: Queso duro (1 oz/28 g) / 6 g  Elija: Queso descremado (1 oz/28 g) / 2-3 g  Evite: Queso cottage, 4% grasa (1 taza)/ 6.5 g  Elija: Queso cottage con bajo contenido de grasa, 1% grasa (1 taza)/ 1.5 g  Evite: Helado (1 taza) / 9 g  Elija: Sorbete (1 taza) / 2.5 g  Elija: Yogurt helado sin contenido de grasa (1 taza) / 0.3 g  Elija: Barras de fruta congeladas / vestigios  Evite: Crema batida (1 cucharada) / 3.5 g  Elija: Batidos glac sin lcteos (1 cucharada) / 1 g Condimentos / Grasas saturadas (g)  Evite: Mayonesa (1 cucharada) / 2 g  Elija: Mayonesa con bajo contenido de grasa (1 cucharada) / 1 g  Evite: [place]eca (1 cucharada) / 7 g  Elija: Margarina extra light (1 cucharada) / 1 g  Evite: Aceite de coco (1 cucharada) / 11.8 g  Elija: Aceite de oliva (1 cucharada) / 1.8 g  Elija: Aceite de maz (1 cucharada) / 1.7 g  Elija: Aceite de crtamo (1 cucharada) / 1.2 g  Elija: Aceite de girasol (1 cucharada) / 1.4 g  Elija: Aceite de soja (1 cucharada) / 2.4 g  Elija: Aceite de canola (1 cucharada) / 1 g Document Released: 10/02/2005 Document Revised: 12/25/2011 ExitCare Patient Information 2014 [place], [place].

## 2013-05-22 NOTE — Progress Notes (Signed)
  Subjective:    Patient ID: Gabriella Garcia, female    DOB: 11/13/1975, 36 y.o.   MRN: [id number] MUSE,ROCHELLE D., PA-C  HISTORY VIA INTERPRETER.  HPI FELT PAIN ONE TIME RECENTLY(1 BAD). THIS PAST SAT. BENT DOWN AND THEN FELT THE PAIN. POINTED AT RLQ. LASTED 4-5 HRS. HAS HEADACHES AND DIZZINESS. NO NAUSEA,OR VOMITING. HAS CYCLE JUL 24 AND NOW SHE HAD IT AUG 4: SPOTTING. ANY CHANCE SHE'S PREGNANT. A WEEK AGO MAY HAVE HAD A LITTLE CONSTIPATION. NO DIARRHEA. ALWAYS PAIN IN RLQ AND THEN MOVES AROUND THE BELLY BUTTON. NO MELENA, PROBLEMS SWALLOWING. FEELS BURNING SENSATION MID/UPR CENTRAL ABD..   ONLY FELT PAIN-MILD(3).    No past medical history on file.  No past medical history on file.  No Known Allergies  Current Outpatient Prescriptions  Medication Sig Dispense Refill  . omeprazole (PRILOSEC) 20 MG capsule 1 po 30 mins prior to meals bid  60 capsule  11   TAKING OCPs DAILY.        Review of Systems     Objective:   Physical Exam  Vitals reviewed. Constitutional: She is oriented to person, place, and time. She appears well-nourished. No distress.  HENT:  Head: Normocephalic and atraumatic.  Mouth/Throat: Oropharynx is clear and moist. No oropharyngeal exudate.  Eyes: Pupils are equal, round, and reactive to light. No scleral icterus.  Neck: Normal range of motion. Neck supple.  Cardiovascular: Normal rate, regular rhythm and normal heart sounds.   Pulmonary/Chest: Effort normal and breath sounds normal. No respiratory distress.  Abdominal: Soft. Bowel sounds are normal. She exhibits no distension. There is no tenderness.  Musculoskeletal: She exhibits no edema.  Lymphadenopathy:    She has no cervical adenopathy.  Neurological: She is alert and oriented to person, place, and time.  NO FOCAL DEFICITS   Psychiatric:  FLAT AFFECT, NL MOOD           Assessment & Plan:   

## 2013-05-22 NOTE — Assessment & Plan Note (Signed)
MOST LIKELY DUE TO IBS-C,LESS LIKELY COLON CA OR ILEITIS.  COLONOSCOPY AUG 19. FOLLOW A HIGH FIBER/LOW FAT DIET. SEE INFO BELOW. USE BENTYL(DICYCLOMINE) ONE OR TWO WHEN PAIN STARTS AND REPEAT IN 1 HOUR IF PAIN DOES NOT RESOLVE.  USE FIBER POWDER OR 1 PACKET ONCE DAILY FOR 3 DAYS THEN TWICE DAILY FOR 3 DAYS THEN THREE TIMES A DAY. USE TYLENOL, IBUPROFEN, OR NAPROXEN AS NEED FOR ABDOMINAL PAIN. TAKE MEDS WITH FOOD OR MILK. DRINK WATER TO KEEP YOUR URINE LIGHT YELLOW. FOLLOW UP IN 3 MOS.

## 2013-05-23 ENCOUNTER — Encounter (HOSPITAL_COMMUNITY): Payer: Self-pay | Admitting: Pharmacy Technician

## 2013-05-23 NOTE — Progress Notes (Signed)
Reminder in epic °

## 2013-05-26 NOTE — Progress Notes (Signed)
CC'd to PCP 

## 2013-06-03 ENCOUNTER — Encounter (HOSPITAL_COMMUNITY): Payer: Self-pay | Admitting: *Deleted

## 2013-06-03 ENCOUNTER — Ambulatory Visit (HOSPITAL_COMMUNITY)
Admission: RE | Admit: 2013-06-03 | Discharge: 2013-06-03 | Disposition: A | Discharge status: Home / Self Care | Payer: Self-pay | Source: Ambulatory Visit | Attending: Gastroenterology | Admitting: Gastroenterology

## 2013-06-03 ENCOUNTER — Encounter (HOSPITAL_COMMUNITY)
Admission: RE | Disposition: A | Discharge status: Home / Self Care | Payer: Self-pay | Source: Ambulatory Visit | Attending: Gastroenterology

## 2013-06-03 DIAGNOSIS — R1013 Epigastric pain: Secondary | ICD-10-CM

## 2013-06-03 DIAGNOSIS — K648 Other hemorrhoids: Secondary | ICD-10-CM | POA: Insufficient documentation

## 2013-06-03 DIAGNOSIS — R1031 Right lower quadrant pain: Secondary | ICD-10-CM

## 2013-06-03 DIAGNOSIS — Q438 Other specified congenital malformations of intestine: Secondary | ICD-10-CM | POA: Insufficient documentation

## 2013-06-03 HISTORY — PX: COLONOSCOPY: SHX5424

## 2013-06-03 SURGERY — COLONOSCOPY
Anesthesia: Moderate Sedation

## 2013-06-03 MED ORDER — PROMETHAZINE HCL 25 MG/ML IJ SOLN
12.5000 mg | Freq: Once | INTRAMUSCULAR | Status: AC
  Administered 2013-06-03: 12.5 mg via INTRAVENOUS

## 2013-06-03 MED ORDER — SODIUM CHLORIDE 0.9 % IV SOLN
INTRAVENOUS | Status: DC
  Administered 2013-06-03: 10:00:00 via INTRAVENOUS

## 2013-06-03 MED ORDER — SODIUM CHLORIDE 0.9 % IJ SOLN
INTRAMUSCULAR | Status: AC
  Filled 2013-06-03: qty 10

## 2013-06-03 MED ORDER — MEPERIDINE HCL 100 MG/ML IJ SOLN
INTRAMUSCULAR | Status: DC | PRN
  Administered 2013-06-03: 25 mg via INTRAVENOUS

## 2013-06-03 MED ORDER — MEPERIDINE HCL 100 MG/ML IJ SOLN
INTRAMUSCULAR | Status: AC
  Filled 2013-06-03: qty 2

## 2013-06-03 MED ORDER — MIDAZOLAM HCL 5 MG/5ML IJ SOLN
INTRAMUSCULAR | Status: AC
  Filled 2013-06-03: qty 10

## 2013-06-03 MED ORDER — AMITRIPTYLINE HCL 10 MG PO TABS: ORAL_TABLET | ORAL | Status: DC

## 2013-06-03 MED ORDER — STERILE WATER FOR IRRIGATION IR SOLN
Status: DC | PRN
  Administered 2013-06-03: 11:00:00

## 2013-06-03 MED ORDER — MIDAZOLAM HCL 5 MG/5ML IJ SOLN
INTRAMUSCULAR | Status: DC | PRN
  Administered 2013-06-03: 2 mg via INTRAVENOUS
  Administered 2013-06-03: 1 mg via INTRAVENOUS

## 2013-06-03 MED ORDER — PROMETHAZINE HCL 25 MG/ML IJ SOLN
INTRAMUSCULAR | Status: AC
  Filled 2013-06-03: qty 1

## 2013-06-03 NOTE — H&P (View-Only) (Signed)
  Subjective:    Patient ID: Gabriella Garcia, female    DOB: 1976-01-13, 37 y.o.   MRN: [id number] MUSE,ROCHELLE D., PA-C  HISTORY VIA INTERPRETER.  HPI FELT PAIN ONE TIME RECENTLY(1 BAD). THIS PAST SAT. BENT DOWN AND THEN FELT THE PAIN. POINTED AT RLQ. LASTED 4-5 HRS. HAS HEADACHES AND DIZZINESS. NO NAUSEA,OR VOMITING. HAS CYCLE JUL 24 AND NOW SHE HAD IT AUG 4: SPOTTING. ANY CHANCE SHE'S PREGNANT. A WEEK AGO MAY HAVE HAD A LITTLE CONSTIPATION. NO DIARRHEA. ALWAYS PAIN IN RLQ AND THEN MOVES AROUND THE BELLY BUTTON. NO MELENA, PROBLEMS SWALLOWING. FEELS BURNING SENSATION MID/UPR CENTRAL ABD.Marland Kitchen   ONLY FELT PAIN-MILD(3).    No past medical history on file.  No past medical history on file.  No Known Allergies  Current Outpatient Prescriptions  Medication Sig Dispense Refill  . omeprazole (PRILOSEC) 20 MG capsule 1 po 30 mins prior to meals bid  60 capsule  11   TAKING OCPs DAILY.        Review of Systems     Objective:   Physical Exam  Vitals reviewed. Constitutional: She is oriented to person, place, and time. She appears well-nourished. No distress.  HENT:  Head: Normocephalic and atraumatic.  Mouth/Throat: Oropharynx is clear and moist. No oropharyngeal exudate.  Eyes: Pupils are equal, round, and reactive to light. No scleral icterus.  Neck: Normal range of motion. Neck supple.  Cardiovascular: Normal rate, regular rhythm and normal heart sounds.   Pulmonary/Chest: Effort normal and breath sounds normal. No respiratory distress.  Abdominal: Soft. Bowel sounds are normal. She exhibits no distension. There is no tenderness.  Musculoskeletal: She exhibits no edema.  Lymphadenopathy:    She has no cervical adenopathy.  Neurological: She is alert and oriented to person, place, and time.  NO FOCAL DEFICITS   Psychiatric:  FLAT AFFECT, NL MOOD           Assessment & Plan:

## 2013-06-03 NOTE — Interval H&P Note (Signed)
History and Physical Interval Note:  06/03/2013 10:21 AM  Gabriella Garcia  has presented today for surgery, with the diagnosis of Abdominal Pain  The various methods of treatment have been discussed with the patient and family. After consideration of risks, benefits and other options for treatment, the patient has consented to  Procedure(s) with comments: COLONOSCOPY (N/A) - 9:45 as a surgical intervention .  The patient's history has been reviewed, patient examined, no change in status, stable for surgery.  I have reviewed the patient's chart and labs.  Questions were answered to the patient's satisfaction.     [employer]

## 2013-06-03 NOTE — Op Note (Signed)
[facility] [address] [place], [address]   COLONOSCOPY PROCEDURE REPORT  PATIENT: Gabriella Garcia, Gabriella Garcia  MR#: [id number] BIRTHDATE: Dec 03, 1975 , 36  yrs. old GENDER: Female ENDOSCOPIST: Jonette Eva, MD REFERRED XB:[id number] Muse, PA PROCEDURE DATE:  06/03/2013 PROCEDURE:   Colonoscopy, diagnostic INDICATIONS:RLQ abdominal pain. MEDICATIONS: PREOP: Promethazine (Phenergan) 12.5mg  IV, Demerol 25 mg IV, and Versed 4 mg IV  DESCRIPTION OF PROCEDURE:    Physical exam was performed.  Informed consent was obtained from the patient after explaining the benefits, risks, and alternatives to procedure.  The patient was connected to monitor and placed in left lateral position. Continuous oxygen was provided by nasal cannula and IV medicine administered through an indwelling cannula.  After administration of sedation and rectal exam, the patients rectum was intubated and the EC-[id number] ([id number])  colonoscope was advanced under direct visualization to the ileum.  The scope was removed slowly by carefully examining the color, texture, anatomy, and integrity mucosa on the way out.  The patient was recovered in endoscopy and discharged home in satisfactory condition.    COLON FINDINGS: The mucosa appeared normal in the terminal ileum.  , A normal appearing cecum, ileocecal valve, and appendiceal orifice were identified.  The ascending, hepatic flexure, transverse, splenic flexure, descending, sigmoid colon and rectum appeared unremarkable.  No polyps or cancers were seen.  , Small internal hemorrhoids were found.  , and The colon was redundant.  The patient was moved on to their back to reach the cecum.  Manual abdominal counter-pressure was used to reach the cecum.  PREP QUALITY: good.   CECAL W/D TIME: 19 minutes     COMPLICATIONS: None  ENDOSCOPIC IMPRESSION: 1.   Normal mucosa in the terminal ileum 2.   Normal colon 3.   Small internal hemorrhoids 4.   The colon  was redundant  RECOMMENDATIONS: ADD ELAVIL AT BEDTIME TO HELP WITH PAIN. CONSIDER CAPSULE ENDOSCOPY AND/OR GENERAL SURGERY CONSULT FOR CHRONIC ABDOMINAL PAIN. FOLLOW A HIGH FIBER DIET.  AVOID ITEMS THAT CAUSE BLOATING. CONTINUE PRILOSEC EVERY MORNING WITH BREAKFAST. USE BENTYL(DICYCLOMINE) ONE OR TWO WHEN PAIN STARTS AND REPEAT IN 1 HOUR IF PAIN DOES NOT RESOLVE. USE FIBER POWDER THREE TIMES A DAY. USE TYLENOL, IBUPROFEN, OR NAPROXEN AS NEED FOR ABDOMINAL PAIN. DRINK WATER TO KEEP YOUR URINE LIGHT YELLOW. FOLLOW UP IN 3 MOS. Next colonoscopy in 10 years.       _______________________________ Rosalie DoctorJonette Eva, MD 06/03/2013 3:44 PM     PATIENT NAME:  Gabriella Garcia, Gabriella Garcia MR#: [id number]

## 2013-06-03 NOTE — Progress Notes (Signed)
Patient's niece Rob Hickman in with patient. Patient's history verified through niece. Patient questioned about thoughts of harming self and patient states, "yes because sometimes the pain is so bad." Called Spanish interpreter line and spoke with interpreter [phone] and patient questioned further about thoughts of harming self. Patient denies thoughts of suicide and said question was misinterpreted. Questioned again about thoughts of harming and killing self and patient denies. Dr. Darrick Penna notified of the above and will proceed with procedure.

## 2013-06-04 ENCOUNTER — Other Ambulatory Visit: Payer: Self-pay | Admitting: Gastroenterology

## 2013-06-04 DIAGNOSIS — G8929 Other chronic pain: Secondary | ICD-10-CM

## 2013-06-05 ENCOUNTER — Encounter (HOSPITAL_COMMUNITY): Payer: Self-pay | Admitting: *Deleted

## 2013-06-05 ENCOUNTER — Encounter (HOSPITAL_COMMUNITY)
Admission: RE | Disposition: A | Discharge status: Home / Self Care | Payer: Self-pay | Source: Ambulatory Visit | Attending: Gastroenterology

## 2013-06-05 ENCOUNTER — Ambulatory Visit (HOSPITAL_COMMUNITY)
Admission: RE | Admit: 2013-06-05 | Discharge: 2013-06-05 | Disposition: A | Discharge status: Home / Self Care | Payer: Self-pay | Source: Ambulatory Visit | Attending: Gastroenterology | Admitting: Gastroenterology

## 2013-06-05 DIAGNOSIS — K2980 Duodenitis without bleeding: Secondary | ICD-10-CM

## 2013-06-05 DIAGNOSIS — K2970 Gastritis, unspecified, without bleeding: Secondary | ICD-10-CM | POA: Insufficient documentation

## 2013-06-05 DIAGNOSIS — K2960 Other gastritis without bleeding: Secondary | ICD-10-CM

## 2013-06-05 DIAGNOSIS — R109 Unspecified abdominal pain: Secondary | ICD-10-CM

## 2013-06-05 HISTORY — PX: GIVENS CAPSULE STUDY: SHX5432

## 2013-06-05 SURGERY — IMAGING PROCEDURE, GI TRACT, INTRALUMINAL, VIA CAPSULE

## 2013-06-06 ENCOUNTER — Encounter (HOSPITAL_COMMUNITY): Payer: Self-pay | Admitting: Gastroenterology

## 2013-06-10 ENCOUNTER — Encounter (HOSPITAL_COMMUNITY): Payer: Self-pay | Admitting: Gastroenterology

## 2013-07-10 ENCOUNTER — Telehealth: Payer: Self-pay | Admitting: Gastroenterology

## 2013-07-10 NOTE — Op Note (Addendum)
06/05/2013  5:13 PM  PATIENT:  Gabriella Garcia  37 y.o. female  PRE-OPERATIVE DIAGNOSIS:  CHRONIC ABDOMINAL PAIN  POST-OPERATIVE DIAGNOSIS:  GASTRITIS/DUODENITIS  PROCEDURE:  Procedure(s) with comments: GIVENS CAPSULE STUDY (N/A) - 7:30  SURGEON:  Surgeon(s) and Role:    * West Bali, MD - Primary  PATIENT DATA: WEIGHT: 172 LBS     WAIST:  38 IN    HEIGHT:   63 IN  GASTRIC PASSAGE TIME: 51 m, SB PASSAGE TIME: 2H 57m  RESULTS: LIMITED views of gastric mucosa due to retained contents. EROSIVE GASTRITIS SEEN. MILD DUODENITIS. SMALL BOWEL:  No ULCERS, masses or AVMs seen.  LIMITED VIEWS OF THE COLON DUE TO RETAINED CONTENTS. No old blood or fresh blood in the stomach, small bowel, or colon.  DIAGNOSIS: NORMAL GIVENS STUDY-ABDOMINAL PAIN DUE TO GASTRITIS/DUIDENITIS OR FUNCTIONAL ABDOMNIAL PAIN  Plan: CONTINUE PRILOSEC EVERY MORNING WITH BREAKFAST.  FOLLOW A HIGH FIBER/LOW FAT DIET. SEE INFO BELOW.  USE BENTYL(DICYCLOMINE) ONE OR TWO WHEN PAIN STARTS AND REPEAT IN 1 HOUR IF PAIN DOES NOT RESOLVE.  USE FIBER POWDER OR 1 PACKET ONCE DAILY FOR 3 DAYS THEN TWICE DAILY FOR 3 DAYS THEN THREE TIMES A DAY.  USE TYLENOL, IBUPROFEN, OR NAPROXEN AS NEED FOR ABDOMINAL PAIN.  DRINK WATER TO KEEP YOUR URINE LIGHT YELLOW.  FOLLOW UP IN NOV 2014.

## 2013-07-10 NOTE — Telephone Encounter (Signed)
PLEASE CALL PT'S ADVOCAT. HER GIVENS CAPSULE SHOWS GASTRITIS AND DUODENITIS. BOTH CAUSE ABDOMINAL PAIN IN THE UPPER AND MIDDLE ABDOMEN. HER SMALL BOWEL IS NORMAL. SHE SHOULD:  SEE A SURGEON IF HER ABDOMINAL PAIN CONTINUES TO BE SEVERE. I CAN MAKE THE REFERRAL. CONTINUE PRILOSEC EVERY MORNING WITH BREAKFAST.  FOLLOW A HIGH FIBER/LOW FAT DIET.  USE BENTYL(DICYCLOMINE) ONE OR TWO WHEN PAIN STARTS AND REPEAT IN 1 HOUR IF PAIN DOES NOT RESOLVE.  USE FIBER POWDER OR 1 PACKET THREE TIMES A DAY.  USE TYLENOL, IBUPROFEN, OR NAPROXEN AS NEED FOR ABDOMINAL PAIN.  DRINK WATER TO KEEP YOUR URINE LIGHT YELLOW.  FOLLOW UP IN NOV 2014.

## 2013-07-11 NOTE — Telephone Encounter (Signed)
Pt is aware of OV on 11/19 at 230 with SF per LAW

## 2013-07-11 NOTE — Telephone Encounter (Signed)
Tried to call. Vm not set up. Mailing a letter with attn Josephina Gip for a return call for results.

## 2013-07-14 NOTE — Telephone Encounter (Signed)
Gabriella Garcia returned call and was informed and I will print a copy and mail to her.

## 2013-08-04 NOTE — Progress Notes (Signed)
REVIEWED.  EGD JUN 2014 GASTRITIS AUG 2014 GUVENS CAPSULE GASTRITIS/DUODENITIS

## 2013-09-03 ENCOUNTER — Telehealth: Payer: Self-pay | Admitting: Gastroenterology

## 2013-09-03 ENCOUNTER — Ambulatory Visit: Payer: Self-pay | Admitting: Gastroenterology

## 2013-09-03 NOTE — Telephone Encounter (Signed)
REVIEWED.  

## 2013-09-03 NOTE — Telephone Encounter (Signed)
Pt was a no show

## 2017-04-04 NOTE — Congregational Nurse Program (Signed)
Congregational Nurse Program Note  Date of Encounter: 04/03/2017  Past Medical History: Past Medical History:  Diagnosis Date  . GERD (gastroesophageal reflux disease)     Encounter Details:     CNP Questionnaire - 04/03/17 0908      Patient Demographics   Is this a new or existing patient? New   Patient is considered a/an Immigrant   Race Latino/Hispanic     Patient Assistance   Location of Patient Assistance [facility] [place]   Patient's financial/insurance status Low Income;Self-Pay (Uninsured)   Uninsured Patient ([place] Card/Care Connects) Yes   Interventions Counseled to make appt. with provider;Assisted patient in making appt.;Referred to ED/Urgent Care   Patient referred to apply [facility] the following [facility]   Food insecurities addressed Not Applicable   Transportation assistance No   Assistance securing medications No   Educational health offerings Navigating the healthcare system;Nutrition;Chronic disease     Encounter Details   Primary purpose of visit Chronic Illness/Condition Visit;Education/Health Concerns;Navigating the Healthcare System   Was an Emergency Department visit averted? No   Does patient have a medical provider? Yes   Patient referred to Area Agency;Clinic;Emergency Department   Was a mental health screening completed? (GAINS tool) No   Does patient have dental issues? No   Does patient have vision issues? No   Does your patient have an abnormal blood pressure today? No   Since previous encounter, have you referred patient for abnormal blood pressure that resulted in a new diagnosis or medication change? No   Does your patient have an abnormal blood glucose today? No   Since previous encounter, have you referred patient for abnormal blood glucose that resulted in a new diagnosis or medication change? No   Was there a life-saving intervention made? No     [facility] Interview done  with the assistance of Certified Medical Interpreter Kathaleen BuryMilena Alvarez. Client is Spanish speaking, understands some [place]. Client currently lives with her husband and two children , ages 445 and 3118 months. 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 Client [facility], last appointment see was approximately 1 month ago per client Client is a very difficult historian. Client here today complaining of multiple problems. Today complaint of right neck and shoulder pain that radiates to her right arm. She denies any neck or shoulder injury. She states this has been going on now for 3 to 4 months. Client experiences pain with light palpation to the neck and scapular area on the right side. She denies pain on the left.  Blood pressure today 117/77 pulse regular at 60. Client states the health department gave her some type of medicine for her shoulder and neck pain , but did not bring the bottle and does not know the name. ( client to call Kathaleen BuryMilena alvarez back with the name of medication.) she states it helped but made her "feel funny". She also states she was told to take an 81 mg aspirin due to pain in her midsternal chest area that has also been off and on for past 3 months. Client reports NO chest pain during this visit. Client reports that pain begins at anytime, during rest or sleep. She does report shortness of breath with it and states she feels chills during episode, but denies fever. She states also that at times she is awakened during the night with feeling that her heart  is "racing". She denies stress at ths time. She states nothing alleviates the pain, it subsides on its own. Upon further review client does report pain with deep inspiration , she denies recent illness, but reports just recently she has had a non-productive cough. "sorness" and tenderness reproduced with light palpation of the chest wall. RN  discussed with client that should she have chest pain that she should call 911 or go to the emergency room. She states health department told her the same, but she has not gone. She also reports she has turned in the [facility] discount application a "while ago" but has not heard  back from it. She states the health department was going to send her to cardiologist once application approved. RN via interpreter discussed following up with application that more information may be needed. [facility] Customer service number given to call for follow up for client and interpreter will also attempt to assist client in doing this. Client denies any GERD, nor previous GI problems. Client states that she had an endoscopy and colonoscopy , but states they found "nothing" Reports from Dr. Darrick Penna available in [facility] for review.  Client reports that testing was done then due to lower abdominal pain that begins in the right lower quadrant and radiates across lower abdomen. She states she is not taking any medications for that. She does report history of constipation and RN reviewed to drink more water, increase dietary fiber from fresh fruits and vegetables. Client states she does eat fruits and vegetables and that her last Bowel movement was the night prior. Denies blood seen in stool. Abdomen is soft and tender to palpation.  In discussing options with client , she does not want to continue at the Health department. She states she was told by Health department MD to come to the free clinic, upon further discussion it appears it was not the MD at the Health department, but rather the interpreter for the health department. I explained to client that [facility] would still need that [facility] application completed and approved in order to refer client to specialist such as cardiology. Client states she understands and would still prefer to transfer to the [facility]. Referral made and appointment secured for  04/05/17 at 0830 am. RN again stressed the importance of seeking emergency room evaluation for Chest pains and client verbalized she understood and would do so. Client still denies any chest pain at present.  PLAN: Seek emergency treatment for any further Chest pain and shortness of breath [facility]ointment with [facility] on [facility]17 Client to follow up with [facility] discount application and interpreter to assist if needed.contact numbers for [facility] Customer service given.  Will follow up as needed.

## 2017-04-05 ENCOUNTER — Other Ambulatory Visit (HOSPITAL_COMMUNITY)
Admission: RE | Admit: 2017-04-05 | Discharge: 2017-04-05 | Disposition: A | Discharge status: Home / Self Care | Payer: Self-pay | Source: Ambulatory Visit | Attending: Physician Assistant | Admitting: Physician Assistant

## 2017-04-05 ENCOUNTER — Encounter: Payer: Self-pay | Admitting: Physician Assistant

## 2017-04-05 ENCOUNTER — Ambulatory Visit: Payer: Self-pay | Admitting: Physician Assistant

## 2017-04-05 VITALS — BP 106/72 | HR 63 | Temp 97.9°F | Ht 63.5 in | Wt 181.5 lb

## 2017-04-05 DIAGNOSIS — R200 Anesthesia of skin: Secondary | ICD-10-CM

## 2017-04-05 DIAGNOSIS — R109 Unspecified abdominal pain: Secondary | ICD-10-CM

## 2017-04-05 DIAGNOSIS — M542 Cervicalgia: Secondary | ICD-10-CM

## 2017-04-05 DIAGNOSIS — Z13220 Encounter for screening for lipoid disorders: Secondary | ICD-10-CM

## 2017-04-05 DIAGNOSIS — R079 Chest pain, unspecified: Secondary | ICD-10-CM

## 2017-04-05 DIAGNOSIS — Z550 Illiteracy and low-level literacy: Secondary | ICD-10-CM

## 2017-04-05 DIAGNOSIS — Z131 Encounter for screening for diabetes mellitus: Secondary | ICD-10-CM

## 2017-04-05 DIAGNOSIS — G8929 Other chronic pain: Secondary | ICD-10-CM

## 2017-04-05 DIAGNOSIS — R202 Paresthesia of skin: Secondary | ICD-10-CM

## 2017-04-05 DIAGNOSIS — M79601 Pain in right arm: Secondary | ICD-10-CM

## 2017-04-05 LAB — COMPREHENSIVE METABOLIC PANEL
ALT: 11 U/L — AB (ref 14–54)
AST: 16 U/L (ref 15–41)
Albumin: 3.9 g/dL (ref 3.5–5.0)
Alkaline Phosphatase: 65 U/L (ref 38–126)
Anion gap: 6 (ref 5–15)
BUN: 13 mg/dL (ref 6–20)
CO2: 25 mmol/L (ref 22–32)
CREATININE: 0.63 mg/dL (ref 0.44–1.00)
Calcium: 8.5 mg/dL — ABNORMAL LOW (ref 8.9–10.3)
Chloride: 107 mmol/L (ref 101–111)
GFR calc Af Amer: >60 mL/min (ref >60)
GFR calc non Af Amer: >60 mL/min (ref >60)
Glucose, Bld: 99 mg/dL (ref 65–99)
POTASSIUM: 3.9 mmol/L (ref 3.5–5.1)
SODIUM: 138 mmol/L (ref 135–145)
Total Bilirubin: 0.6 mg/dL (ref 0.3–1.2)
Total Protein: 7 g/dL (ref 6.5–8.1)

## 2017-04-05 LAB — CBC WITH DIFFERENTIAL/PLATELET
BASOS ABS: 0.1 10*3/uL (ref 0.0–0.1)
BASOS PCT: 1 %
EOS ABS: 0.2 10*3/uL (ref 0.0–0.7)
Eosinophils Relative: 3 %
HCT: 36.8 % (ref 36.0–46.0)
Hemoglobin: 12 g/dL (ref 12.0–15.0)
Lymphocytes Relative: 33 %
Lymphs Abs: 2.6 10*3/uL (ref 0.7–4.0)
MCH: 27.8 pg (ref 26.0–34.0)
MCHC: 32.6 g/dL (ref 30.0–36.0)
MCV: 85.4 fL (ref 78.0–100.0)
MONO ABS: 0.4 10*3/uL (ref 0.1–1.0)
Monocytes Relative: 5 %
NEUTROS PCT: 58 %
Neutro Abs: 4.5 10*3/uL (ref 1.7–7.7)
Platelets: 161 10*3/uL (ref 150–400)
RBC: 4.31 MIL/uL (ref 3.87–5.11)
RDW: 15.2 % (ref 11.5–15.5)
WBC: 7.7 10*3/uL (ref 4.0–10.5)

## 2017-04-05 LAB — LIPID PANEL
Cholesterol: 158 mg/dL (ref 0–200)
HDL: 37 mg/dL — AB (ref >40)
LDL Cholesterol: 102 mg/dL — ABNORMAL HIGH (ref 0–99)
TRIGLYCERIDES: 96 mg/dL (ref <150)
Total CHOL/HDL Ratio: 4.3 RATIO
VLDL: 19 mg/dL (ref 0–40)

## 2017-04-05 LAB — TSH: TSH: 3.746 u[IU]/mL (ref 0.350–4.500)

## 2017-04-05 MED ORDER — PANTOPRAZOLE SODIUM 40 MG PO TBEC: DELAYED_RELEASE_TABLET | ORAL | 1 refills | Status: DC

## 2017-04-05 NOTE — Progress Notes (Signed)
BP 106/72 (BP Location: Left Arm, Patient Position: Sitting, Cuff Size: Normal)   Pulse 63   Temp 97.9 F (36.6 C)   Ht 5' 3.5" (1.613 m)   Wt 181 lb 8 oz (82.3 kg)   SpO2 98%   BMI 31.65 kg/m    Subjective:    Patient ID: Gabriella Garcia, female    DOB: 07/10/1976, 41 y.o.   MRN: [id number]  HPI: Gabriella Garcia is a 41 y.o. female presenting on 04/05/2017 for New Patient (Initial Visit) (previous patient of [facility]); Arm Pain (R arm pain. pt states yesterday when she woke up her arm was numb. pt states at times her arm feels cramped.); and Palpitations (pt states also feeling SOB when palpatations occur)   HPI   Chief Complaint  Patient presents with  . New Patient (Initial Visit)    previous patient of [facility]  . Arm Pain    R arm pain. pt states yesterday when she woke up her arm was numb. pt states at times her arm feels cramped.  . Palpitations    pt states also feeling SOB when palpatations occur    EGD/colonoscopy 2014 for chronic abd pain  Pt is a non-reader.  She is a difficult historian.   [facility] Last seen there January 17, 2017.  She says the interpreter at the health dept told her that they couldn't help her and that she should come here.    When asked what was bothering her, she c/o pain right arm.   She says yesterday she woke up with her arm hurting.  She also says her neck has been hurting lately.  Her arm has been hurting for about a month.   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   Pt states her arm does not hurt more or less when she is at work- it hurts the same.  Pt is right hand dominant.   She says she gets intermittent RUE numbess and tingling.  She does not get that on the left.   Pt also says she awakens in the morning with chest pain some times.  She also some times gets pain in the chest and she feels like she has been exerting when she hasn't.    Pt says  she submitted [facility] application about a month ago.  She hasn't heard whether she got approved.   Relevant past medical, surgical, family and social history reviewed and updated as indicated. Interim medical history since our last visit reviewed. Allergies and medications reviewed and updated.   Current Outpatient Prescriptions:  .  loratadine (ALLERGY) 10 MG tablet, Take 10 mg by mouth daily., Disp: , Rfl:   Review of Systems  Per HPI unless specifically indicated above     Objective:    BP 106/72 (BP Location: Left Arm, Patient Position: Sitting, Cuff Size: Normal)   Pulse 63   Temp 97.9 F (36.6 C)   Ht 5' 3.5" (1.613 m)   Wt 181 lb 8 oz (82.3 kg)   SpO2 98%   BMI 31.65 kg/m   Wt Readings from Last 3 Encounters:  04/05/17 181 lb 8 oz (82.3 kg)  06/05/13 172 lb (78 kg)  06/03/13 172 lb (78 kg)     EKG- sinus brady. Not st-t changes. No previous for comparison  Physical Exam  Constitutional: She is oriented to person, place, and time. She appears well-developed and well-nourished.  HENT:  Head: Normocephalic and atraumatic.  Mouth/Throat: Uvula is midline, oropharynx is clear and moist and mucous membranes are normal. No dental abscesses. No oropharyngeal exudate.  Eyes: Conjunctivae and EOM are normal. Pupils are equal, round, and reactive to light.  Neck: Normal range of motion and phonation normal. Neck supple. No neck rigidity. No erythema and normal range of motion present. No thyromegaly present.    Pt has good ROM but is very tender and grabbed examiner with mild palpation.  There is no swelling or redness  Cardiovascular: Normal rate and regular rhythm.   Pulmonary/Chest: Effort normal and breath sounds normal.  Abdominal: Soft. Bowel sounds are normal. She exhibits no mass. There is no hepatosplenomegaly. There is no tenderness.  Musculoskeletal: She exhibits no edema.       Cervical back: She exhibits tenderness. She exhibits normal range of motion, no  bony tenderness, no swelling and no spasm.  Lymphadenopathy:    She has no cervical adenopathy.  Neurological: She is alert and oriented to person, place, and time. Gait normal.  Skin: Skin is warm and dry.  Psychiatric: She has a normal mood and affect. Her behavior is normal.  Vitals reviewed.       Assessment & Plan:    Encounter Diagnoses  Name Primary?  . Neck pain Yes  . Chest pain, unspecified type   . Unable to read or write   . Right arm pain   . Chronic abdominal pain   . Numbness and tingling   . Screening cholesterol level   . Screening for diabetes mellitus     -pt to Get labs drawn when leaves office today -pt counseled to Avoid caffeine -will Restart ppi- rx pantoprazole. Gave pt coupon -pt given phone number to check on her [facility] discount application -will order CT neck for very unusual pain and tenderness evaluation -pt to F/u 2 wk- (after CT). She is to RTO sooner prn

## 2017-04-05 NOTE — Patient Instructions (Signed)
Artistinancial Counselor at [facility]- [phone]

## 2017-04-06 LAB — HEMOGLOBIN A1C
Hgb A1c MFr Bld: 5.8 % — ABNORMAL HIGH (ref 4.8–5.6)
MEAN PLASMA GLUCOSE: 120 mg/dL

## 2017-04-23 ENCOUNTER — Encounter: Payer: Self-pay | Admitting: Physician Assistant

## 2017-04-23 ENCOUNTER — Ambulatory Visit: Payer: Self-pay | Admitting: Physician Assistant

## 2017-04-23 VITALS — BP 116/74 | HR 52 | Temp 97.9°F | Ht 63.5 in | Wt 183.7 lb

## 2017-04-23 DIAGNOSIS — M542 Cervicalgia: Secondary | ICD-10-CM

## 2017-04-23 DIAGNOSIS — Z550 Illiteracy and low-level literacy: Secondary | ICD-10-CM

## 2017-04-23 NOTE — Progress Notes (Signed)
BP 116/74 (BP Location: Left Arm, Patient Position: Sitting, Cuff Size: Large)   Pulse (!) 52   Temp 97.9 F (36.6 C) (Other (Comment))   Ht 5' 3.5" (1.613 m)   Wt 183 lb 11.2 oz (83.3 kg)   LMP 04/22/2017 (Exact Date)   SpO2 98%   BMI 32.03 kg/m    Subjective:    Patient ID: Gabriella Garcia, female    DOB: 09/15/1976, 41 y.o.   MRN: [id number]  HPI: Gabriella Garcia is a 41 y.o. female presenting on 04/23/2017 for Follow-up   HPI   Pt appt today was supposed to be for after her CT scan neck which has not yet been done.  Pt states pain in neck continues.   Relevant past medical, surgical, family and social history reviewed and updated as indicated. Interim medical history since our last visit reviewed. Allergies and medications reviewed and updated.   Current Outpatient Prescriptions:  .  pantoprazole (PROTONIX) 40 MG tablet, 1 po qd.  Tome una tableta por boca diaria, Disp: 30 tablet, Rfl: 1 .  loratadine (ALLERGY) 10 MG tablet, Take 10 mg by mouth daily., Disp: , Rfl:    Review of Systems  HENT: Negative for trouble swallowing.     Per HPI unless specifically indicated above     Objective:    BP 116/74 (BP Location: Left Arm, Patient Position: Sitting, Cuff Size: Large)   Pulse (!) 52   Temp 97.9 F (36.6 C) (Other (Comment))   Ht 5' 3.5" (1.613 m)   Wt 183 lb 11.2 oz (83.3 kg)   LMP 04/22/2017 (Exact Date)   SpO2 98%   BMI 32.03 kg/m   Wt Readings from Last 3 Encounters:  04/23/17 183 lb 11.2 oz (83.3 kg)  04/05/17 181 lb 8 oz (82.3 kg)  06/05/13 172 lb (78 kg)    Physical Exam  Constitutional: She is oriented to person, place, and time. She appears well-developed and well-nourished.  HENT:  Head: Normocephalic and atraumatic.  Mouth/Throat: Uvula is midline, oropharynx is clear and moist and mucous membranes are normal. No dental abscesses. No oropharyngeal exudate.  Eyes: Conjunctivae and EOM are normal. Pupils are equal, round, and reactive  to light.  Neck: Normal range of motion and phonation normal. Neck supple. No neck rigidity. No erythema and normal range of motion present. No thyromegaly present.    Pt has good ROM but is very tender and grabbed examiner with mild palpation.  There is no swelling or redness  Cardiovascular: Normal rate and regular rhythm.   Pulmonary/Chest: Effort normal and breath sounds normal.  Abdominal: Soft. Bowel sounds are normal. She exhibits no mass. There is no hepatosplenomegaly. There is no tenderness.  Musculoskeletal:       Cervical back: She exhibits tenderness. She exhibits normal range of motion, no bony tenderness, no swelling and no spasm.  Lymphadenopathy:    She has no cervical adenopathy.  Neurological: She is alert and oriented to person, place, and time. Gait normal.  Skin: Skin is warm and dry.  Psychiatric: She has a normal mood and affect. Her behavior is normal.  Vitals reviewed.   Results for orders placed or performed during the hospital encounter of 04/05/17  Hemoglobin A1c  Result Value Ref Range   Hgb A1c MFr Bld 5.8 (H) 4.8 - 5.6 %   Mean Plasma Glucose 120 mg/dL  Lipid panel  Result Value Ref Range   Cholesterol 158 0 - 200 mg/dL   Triglycerides 96 <010<150  mg/dL   HDL 37 (L) >16 mg/dL   Total CHOL/HDL Ratio 4.3 RATIO   VLDL 19 0 - 40 mg/dL   LDL Cholesterol 109 (H) 0 - 99 mg/dL  TSH  Result Value Ref Range   TSH 3.746 0.350 - 4.500 uIU/mL  CBC with Differential/Platelet  Result Value Ref Range   WBC 7.7 4.0 - 10.5 K/uL   RBC 4.31 3.87 - 5.11 MIL/uL   Hemoglobin 12.0 12.0 - 15.0 g/dL   HCT 60.4 54.0 - 98.1 %   MCV 85.4 78.0 - 100.0 fL   MCH 27.8 26.0 - 34.0 pg   MCHC 32.6 30.0 - 36.0 g/dL   RDW 19.1 47.8 - 29.5 %   Platelets 161 150 - 400 K/uL   Neutrophils Relative % 58 %   Neutro Abs 4.5 1.7 - 7.7 K/uL   Lymphocytes Relative 33 %   Lymphs Abs 2.6 0.7 - 4.0 K/uL   Monocytes Relative 5 %   Monocytes Absolute 0.4 0.1 - 1.0 K/uL   Eosinophils  Relative 3 %   Eosinophils Absolute 0.2 0.0 - 0.7 K/uL   Basophils Relative 1 %   Basophils Absolute 0.1 0.0 - 0.1 K/uL  Comprehensive metabolic panel  Result Value Ref Range   Sodium 138 135 - 145 mmol/L   Potassium 3.9 3.5 - 5.1 mmol/L   Chloride 107 101 - 111 mmol/L   CO2 25 22 - 32 mmol/L   Glucose, Bld 99 65 - 99 mg/dL   BUN 13 6 - 20 mg/dL   Creatinine, Ser 6.21 0.44 - 1.00 mg/dL   Calcium 8.5 (L) 8.9 - 10.3 mg/dL   Total Protein 7.0 6.5 - 8.1 g/dL   Albumin 3.9 3.5 - 5.0 g/dL   AST 16 15 - 41 U/L   ALT 11 (L) 14 - 54 U/L   Alkaline Phosphatase 65 38 - 126 U/L   Total Bilirubin 0.6 0.3 - 1.2 mg/dL   GFR calc non Af Amer >60 >60 mL/min   GFR calc Af Amer >60 >60 mL/min   Anion gap 6 5 - 15      Assessment & Plan:   Encounter Diagnoses  Name Primary?  . Neck pain Yes  . Unable to read or write     -reviewed labs with pt -CT scheduled for 7/19 -pt to follow up after CT. RTO sooner prn worsening or new symptoms

## 2017-05-03 ENCOUNTER — Ambulatory Visit (HOSPITAL_COMMUNITY)
Admission: RE | Admit: 2017-05-03 | Discharge: 2017-05-03 | Disposition: A | Discharge status: Home / Self Care | Payer: Self-pay | Source: Ambulatory Visit | Attending: Physician Assistant | Admitting: Physician Assistant

## 2017-05-03 DIAGNOSIS — M79601 Pain in right arm: Secondary | ICD-10-CM | POA: Insufficient documentation

## 2017-05-03 DIAGNOSIS — M542 Cervicalgia: Secondary | ICD-10-CM | POA: Insufficient documentation

## 2017-05-03 MED ORDER — IOPAMIDOL (ISOVUE-300) INJECTION 61%
75.0000 mL | Freq: Once | INTRAVENOUS | Status: AC | PRN
  Administered 2017-05-03: 75 mL via INTRAVENOUS

## 2017-05-08 ENCOUNTER — Ambulatory Visit: Payer: Self-pay | Admitting: Physician Assistant

## 2017-05-08 ENCOUNTER — Encounter: Payer: Self-pay | Admitting: Physician Assistant

## 2017-05-08 VITALS — BP 96/68 | HR 66 | Temp 97.7°F | Ht 63.5 in | Wt 182.5 lb

## 2017-05-08 DIAGNOSIS — Z550 Illiteracy and low-level literacy: Secondary | ICD-10-CM

## 2017-05-08 DIAGNOSIS — M79601 Pain in right arm: Secondary | ICD-10-CM

## 2017-05-08 DIAGNOSIS — M542 Cervicalgia: Secondary | ICD-10-CM

## 2017-05-08 MED ORDER — PREDNISONE 10 MG PO TABS
ORAL_TABLET | ORAL | 0 refills | Status: AC
Start: 2017-05-08 — End: 2017-05-19

## 2017-05-08 NOTE — Progress Notes (Signed)
BP 96/68 (BP Location: Left Arm, Patient Position: Sitting, Cuff Size: Normal)   Pulse 66   Temp 97.7 F (36.5 C) (Other (Comment))   Ht 5' 3.5" (1.613 m)   Wt 182 lb 8 oz (82.8 kg)   LMP 04/22/2017 (Exact Date)   SpO2 98%   BMI 31.82 kg/m    Subjective:    Patient ID: Gabriella Garcia, Gabriella Garcia    DOB: 1976-05-26, 41 y.o.   MRN: [id number]  HPI: Gabriella Garcia is a 41 y.o. Gabriella Garcia presenting on 05/08/2017 for Follow-up   HPI   Pt states neck is still hurting. She Says it feels worse. Radiation into RUE.   She says that it hurts across the whole of her back of her neck.    Pt states that stomach hurts if her L neck is hurting but but it doesn't hurt if her L neck isn't hurting.   Relevant past medical, surgical, family and social history reviewed and updated as indicated. Interim medical history since our last visit reviewed. Allergies and medications reviewed and updated.   Current Outpatient Prescriptions:  .  pantoprazole (PROTONIX) 40 MG tablet, 1 po qd.  Tome una tableta por boca diaria, Disp: 30 tablet, Rfl: 1   Review of Systems  Respiratory: Negative for shortness of breath.   Cardiovascular: Negative for chest pain.  Gastrointestinal: Positive for abdominal pain.    Per HPI unless specifically indicated above     Objective:    BP 96/68 (BP Location: Left Arm, Patient Position: Sitting, Cuff Size: Normal)   Pulse 66   Temp 97.7 F (36.5 C) (Other (Comment))   Ht 5' 3.5" (1.613 m)   Wt 182 lb 8 oz (82.8 kg)   LMP 04/22/2017 (Exact Date)   SpO2 98%   BMI 31.82 kg/m   Wt Readings from Last 3 Encounters:  05/08/17 182 lb 8 oz (82.8 kg)  04/23/17 183 lb 11.2 oz (83.3 kg)  04/05/17 181 lb 8 oz (82.3 kg)    Physical Exam  Constitutional: She is oriented to person, place, and time. She appears well-developed and well-nourished.  HENT:  Head: Normocephalic and atraumatic.  Neck: Trachea normal. Neck supple. No neck rigidity. No edema, no erythema  and normal range of motion present. No thyroid mass and no thyromegaly present.    Tenderness to neck- no point tenderness  Cardiovascular: Normal rate and regular rhythm.   Pulmonary/Chest: Effort normal and breath sounds normal. No respiratory distress. She has no wheezes. She has no rales.  Abdominal: Soft. Bowel sounds are normal. There is no tenderness.  Musculoskeletal: She exhibits no edema.       Right shoulder: She exhibits tenderness. She exhibits normal range of motion, no bony tenderness, no swelling and no effusion.       Arms: Neurological: She is alert and oriented to person, place, and time.  Skin: Skin is warm, dry and intact. No rash noted.  Looked through hair to scalp posterior head- no rash, abscess or other abnormality seen  Psychiatric: She has a normal mood and affect. Her behavior is normal.  Nursing note and vitals reviewed.   Results for orders placed or performed during the hospital encounter of 04/05/17  Hemoglobin A1c  Result Value Ref Range   Hgb A1c MFr Bld 5.8 (H) 4.8 - 5.6 %   Mean Plasma Glucose 120 mg/dL  Lipid panel  Result Value Ref Range   Cholesterol 158 0 - 200 mg/dL   Triglycerides 96 <409 mg/dL  HDL 37 (L) >40 mg/dL   Total CHOL/HDL Ratio 4.3 RATIO   VLDL 19 0 - 40 mg/dL   LDL Cholesterol 865102 (H) 0 - 99 mg/dL  TSH  Result Value Ref Range   TSH 3.746 0.350 - 4.500 uIU/mL  CBC with Differential/Platelet  Result Value Ref Range   WBC 7.7 4.0 - 10.5 K/uL   RBC 4.31 3.87 - 5.11 MIL/uL   Hemoglobin 12.0 12.0 - 15.0 g/dL   HCT 78.436.8 69.636.0 - 29.546.0 %   MCV 85.4 78.0 - 100.0 fL   MCH 27.8 26.0 - 34.0 pg   MCHC 32.6 30.0 - 36.0 g/dL   RDW 28.415.2 13.211.5 - 44.015.5 %   Platelets 161 150 - 400 K/uL   Neutrophils Relative % 58 %   Neutro Abs 4.5 1.7 - 7.7 K/uL   Lymphocytes Relative 33 %   Lymphs Abs 2.6 0.7 - 4.0 K/uL   Monocytes Relative 5 %   Monocytes Absolute 0.4 0.1 - 1.0 K/uL   Eosinophils Relative 3 %   Eosinophils Absolute 0.2 0.0 - 0.7  K/uL   Basophils Relative 1 %   Basophils Absolute 0.1 0.0 - 0.1 K/uL  Comprehensive metabolic panel  Result Value Ref Range   Sodium 138 135 - 145 mmol/L   Potassium 3.9 3.5 - 5.1 mmol/L   Chloride 107 101 - 111 mmol/L   CO2 25 22 - 32 mmol/L   Glucose, Bld 99 65 - 99 mg/dL   BUN 13 6 - 20 mg/dL   Creatinine, Ser 1.020.63 0.44 - 1.00 mg/dL   Calcium 8.5 (L) 8.9 - 10.3 mg/dL   Total Protein 7.0 6.5 - 8.1 g/dL   Albumin 3.9 3.5 - 5.0 g/dL   AST 16 15 - 41 U/L   ALT 11 (L) 14 - 54 U/L   Alkaline Phosphatase 65 38 - 126 U/L   Total Bilirubin 0.6 0.3 - 1.2 mg/dL   GFR calc non Af Amer >60 >60 mL/min   GFR calc Af Amer >60 >60 mL/min   Anion gap 6 5 - 15      Assessment & Plan:    Encounter Diagnoses  Name Primary?  . Neck pain Yes  . Right arm pain   . Unable to read or write     -reviewed lab results with pt.  All normal.  -rx prednisone taper.  Discussed side effects -Counseled to take at least 2 hours separated from protonix dosing -pt can also use some heat or ice on the neck to help with the pain.   -pt to follow up 1 month for recheck.  RTO sooner prn worsening or new symptoms

## 2017-06-12 ENCOUNTER — Encounter: Payer: Self-pay | Admitting: Physician Assistant

## 2017-06-12 ENCOUNTER — Ambulatory Visit: Payer: Self-pay | Admitting: Physician Assistant

## 2017-06-12 VITALS — BP 110/66 | HR 71 | Temp 97.9°F | Ht 63.5 in | Wt 183.0 lb

## 2017-06-12 DIAGNOSIS — R109 Unspecified abdominal pain: Secondary | ICD-10-CM

## 2017-06-12 DIAGNOSIS — G8929 Other chronic pain: Secondary | ICD-10-CM

## 2017-06-12 DIAGNOSIS — M542 Cervicalgia: Secondary | ICD-10-CM

## 2017-06-12 DIAGNOSIS — Z550 Illiteracy and low-level literacy: Secondary | ICD-10-CM

## 2017-06-12 NOTE — Progress Notes (Signed)
   BP 110/66 (BP Location: Left Arm, Patient Position: Sitting, Cuff Size: Normal)   Pulse 71   Temp 97.9 F (36.6 C)   Ht 5' 3.5" (1.613 m)   Wt 183 lb (83 kg)   SpO2 96%   BMI 31.91 kg/m    Subjective:    Patient ID: York Spaniel, female    DOB: [birth year], 41 y.o.   MRN: [id number]  HPI: Keslyn Vandevoort is a 41 y.o. female presenting on 06/12/2017 for Neck Pain   HPI   Pt says her neck is feeling better  She finished her prednisone.  She is not taking her protonix.  She says her abd pain is gone now also  Pt says she had PAP at [facility] earlier this year.   Relevant past medical, surgical, family and social history reviewed and updated as indicated. Interim medical history since our last visit reviewed. Allergies and medications reviewed and updated.  CURRENT MEDS: none  Review of Systems  Respiratory: Negative for shortness of breath.   Cardiovascular: Negative for chest pain.  Gastrointestinal: Negative for abdominal pain.    Per HPI unless specifically indicated above     Objective:    BP 110/66 (BP Location: Left Arm, Patient Position: Sitting, Cuff Size: Normal)   Pulse 71   Temp 97.9 F (36.6 C)   Ht 5' 3.5" (1.613 m)   Wt 183 lb (83 kg)   SpO2 96%   BMI 31.91 kg/m   Wt Readings from Last 3 Encounters:  06/12/17 183 lb (83 kg)  05/08/17 182 lb 8 oz (82.8 kg)  04/23/17 183 lb 11.2 oz (83.3 kg)    Physical Exam  Constitutional: She is oriented to person, place, and time. She appears well-developed and well-nourished.  HENT:  Head: Normocephalic and atraumatic.  Neck: Neck supple.  Cardiovascular: Normal rate and regular rhythm.   Pulmonary/Chest: Effort normal and breath sounds normal.  Abdominal: Soft. Bowel sounds are normal. She exhibits no mass. There is no hepatosplenomegaly. There is no tenderness.  Musculoskeletal: She exhibits no edema.  Lymphadenopathy:    She has no cervical adenopathy.  Neurological: She is alert and oriented  to person, place, and time.  Skin: Skin is warm and dry.  Psychiatric: She has a normal mood and affect. Her behavior is normal.  Vitals reviewed.        Assessment & Plan:   Encounter Diagnoses  Name Primary?  . Neck pain Yes  . Chronic abdominal pain   . Unable to read or write      -pt improved.  She will follow up in 6 months for recheck. She is to RTO sooner prn

## 2017-07-16 ENCOUNTER — Ambulatory Visit: Payer: Self-pay | Admitting: Physician Assistant

## 2017-08-15 ENCOUNTER — Telehealth: Payer: Self-pay | Admitting: Student

## 2017-08-15 NOTE — Telephone Encounter (Signed)
Pt called requesting appointment. Pt states her last menstrual cycle was beginning of Sept. Pt states she missed her cycle beginning of this month (Oct). Pt states she took a pregnancy test last week and it was positive. Pt requests appointment because she has been bleeding since Sunday, Oct. 28, 2018.   Pt states having menstrual cramps. Pt states bleeding is the amount of a regular period. Pt was advised to treat this like a regular menstrual cycle. Pt was advised to go to the ER if pain got worse or if she experienced very heavy bleeding. Pt verbalized understanding.

## 2017-12-12 ENCOUNTER — Ambulatory Visit: Payer: Self-pay | Admitting: Physician Assistant

## 2017-12-12 ENCOUNTER — Encounter: Payer: Self-pay | Admitting: Physician Assistant

## 2017-12-12 VITALS — BP 108/73 | HR 56 | Temp 98.1°F | Ht 63.5 in | Wt 191.5 lb

## 2017-12-12 DIAGNOSIS — Z0000 Encounter for general adult medical examination without abnormal findings: Secondary | ICD-10-CM

## 2017-12-12 DIAGNOSIS — Z550 Illiteracy and low-level literacy: Secondary | ICD-10-CM

## 2017-12-12 DIAGNOSIS — M542 Cervicalgia: Secondary | ICD-10-CM

## 2017-12-12 DIAGNOSIS — R300 Dysuria: Secondary | ICD-10-CM

## 2017-12-12 LAB — POCT URINALYSIS DIPSTICK
BILIRUBIN UA: NEGATIVE
Blood, UA: NEGATIVE
GLUCOSE UA: NEGATIVE
KETONES UA: NEGATIVE
Leukocytes, UA: NEGATIVE
Nitrite, UA: NEGATIVE
PH UA: 6 (ref 5.0–8.0)
Protein, UA: NEGATIVE
Spec Grav, UA: >=1.03 — AB (ref 1.010–1.025)
UROBILINOGEN UA: 0.2 U/dL

## 2017-12-12 NOTE — Progress Notes (Signed)
   BP 108/73 (BP Location: Right Arm, Patient Position: Sitting, Cuff Size: Normal)   Pulse (!) 56   Temp 98.1 F (36.7 C)   Ht 5' 3.5" (1.613 m)   Wt 191 lb 8 oz (86.9 kg)   SpO2 99%   BMI 33.39 kg/m    Subjective:    Patient ID: Gabriella Garcia, female    DOB: 03/02/1976, 42 y.o.   MRN: [id number]  HPI: Gabriella Garcia is a 42 y.o. female presenting on 12/12/2017 for Follow-up   HPI Pt continues with right sided neck pain.  She has had this for long time. No recent changes.  Pt had normal CT July 2018 for this which was unrevealing.  She does c/o burning with urination for one week.   Relevant past medical, surgical, family and social history reviewed and updated as indicated. Interim medical history since our last visit reviewed. Allergies and medications reviewed and updated.  No current outpatient medications on file.   Review of Systems  Respiratory: Negative for shortness of breath.   Cardiovascular: Negative for chest pain.  Gastrointestinal: Negative for abdominal pain, constipation and diarrhea.  Genitourinary: Positive for dysuria.    Per HPI unless specifically indicated above     Objective:    BP 108/73 (BP Location: Right Arm, Patient Position: Sitting, Cuff Size: Normal)   Pulse (!) 56   Temp 98.1 F (36.7 C)   Ht 5' 3.5" (1.613 m)   Wt 191 lb 8 oz (86.9 kg)   SpO2 99%   BMI 33.39 kg/m   Wt Readings from Last 3 Encounters:  12/12/17 191 lb 8 oz (86.9 kg)  06/12/17 183 lb (83 kg)  05/08/17 182 lb 8 oz (82.8 kg)    Physical Exam  Constitutional: She is oriented to person, place, and time. She appears well-developed and well-nourished.  HENT:  Head: Normocephalic and atraumatic.  Neck: Neck supple.  Cardiovascular: Normal rate and regular rhythm.  Pulmonary/Chest: Effort normal and breath sounds normal.  Abdominal: Soft. Bowel sounds are normal. She exhibits no mass. There is no hepatosplenomegaly. There is no tenderness.   Musculoskeletal: She exhibits no edema.       Right shoulder: She exhibits normal range of motion.       Left shoulder: She exhibits normal range of motion.       Cervical back: She exhibits tenderness. She exhibits normal range of motion, no bony tenderness, no swelling, no edema, no deformity, no laceration, no pain and no spasm.  Diffuse mild soft tissue tenderness, R and L.  Good, normal ROM  Lymphadenopathy:    She has no cervical adenopathy.  Neurological: She is alert and oriented to person, place, and time.  Skin: Skin is warm and dry.  Psychiatric: She has a normal mood and affect. Her behavior is normal.  Vitals reviewed.       Assessment & Plan:   Encounter Diagnoses  Name Primary?  . Routine general medical examination at a health care facility Yes  . Neck pain   . Dysuria   . Unable to read or write      -reassured pt that UA shows no signs infection.  Counseled pt to increase water intake --pt counseled to use ice/heat as needed for muscular neck pain -pt to follow up 1 year.  RTO sooner prn

## 2017-12-12 NOTE — Patient Instructions (Signed)
Dolor muscular en los adultos (Muscle Pain, Adult) El dolor muscular (mialgia) puede ser leve o intenso. La mayora de las veces, el dolor dura solo un corto perodo y desaparece sin tratamiento. Es normal sentir algo de dolor muscular despus de comenzar un programa de entrenamiento. Generalmente duelen aquellos msculos que no se utilizan con frecuencia. Puede haber muchas otras causas del dolor muscular, que incluyen las siguientes:  Uso excesivo del msculo o distensin muscular, en especial si la persona no est en buen estado fsico. Esta es la causa ms comn del dolor muscular.  Lesiones.  Moretones.  Virus, como el de la gripe.  Enfermedades infecciosas.  Una afeccin crnica que causa dolor de cabeza, fatiga y dolor muscular con la palpacin (fibromialgia).  Una afeccin, como el lupus, en la que el sistema del cuerpo encargado de combatir las enfermedades ataca a otros rganos (enfermedades autoinmunes o reumatolgicas).  Determinados medicamentos, como los inhibidores de la ECA y las estatinas. Para diagnosticar la causa del dolor muscular, su mdico le realizar un examen fsico y le har preguntas sobre el dolor y cundo comenz. Si el dolor muscular no comenz hace mucho tiempo, el mdico puede esperar antes de hacer diversas pruebas. Si el dolor muscular comenz hace mucho tiempo, el mdico puede decidir que es ms conveniente hacer pruebas de inmediato. En algunos casos, se pueden realizar pruebas para descartar ciertas afecciones o enfermedades. El tratamiento del dolor muscular depende de la causa. El cuidado en el hogar a menudo es suficiente para aliviar el dolor muscular. El mdico tambin puede recetarle un medicamento antiinflamatorio. INSTRUCCIONES PARA EL CUIDADO EN EL HOGAR Actividad  Si el uso excesivo del msculo est provocando dolor muscular: ? Disminuya sus actividades hasta que el dolor desaparezca. ? Si usted no hace actividad fsica con frecuencia, los  ejercicios deben ser suaves y regulares. ? Realice precalentamiento antes de la actividad fsica. Elongue antes y despus de hacer ejercicios. Esto puede ayudar a disminuir el riesgo de que sufra dolor muscular.  No siga haciendo actividad fsica si el dolor es muy intenso. Este tipo de dolor podra indicar que se ha lesionado un msculo. Control del dolor y de las molestias  Si se lo indican, aplique hielo sobre el msculo dolorido: ? Ponga el hielo en una bolsa plstica. ? Coloque una toalla entre la piel y la bolsa de hielo. ? Coloque el hielo durante 20minutos, 2 a 3veces por da.  Tambin puede alternar entre aplicar hielo y aplicar calor como se lo haya indicado el mdico. Para aplicar calor, use la fuente de calor que el mdico le recomiende, como una compresa de calor hmedo o una almohadilla trmica. ? Coloque una toalla entre la piel y la fuente de calor. ? Aplique el calor durante 20 a 30minutos. ? Retire la fuente de calor si la piel se le pone de color rojo brillante. Esto es muy importante si no puede sentir el dolor, el calor ni el fro. Puede correr un riesgo mayor de sufrir quemaduras. Medicamentos  Tome los medicamentos de venta libre y los recetados solamente como se lo haya indicado el mdico.  No conduzca ni use maquinaria pesada mientras toma analgsicos recetados. SOLICITE ATENCIN MDICA SI:  El dolor muscular empeora y los medicamentos no surten efecto.  Tiene dolor muscular que dura ms de 3das.  Tiene una erupcin cutnea o fiebre junto con el dolor muscular.  Tiene dolor muscular despus de una picadura de garrapata.  Tiene dolor muscular mientras hace actividad fsica,   aunque est en buen estado fsico.  Tiene enrojecimiento, sensibilidad o hinchazn junto con el dolor muscular.  Tiene dolor muscular despus de comenzar un medicamento nuevo o de cambiar la dosis de un medicamento. SOLICITE ATENCIN MDICA DE INMEDIATO SI:  Tiene dificultad para  respirar.  Presenta dificultad para tragar.  Tiene dolor muscular junto con rigidez en el cuello, fiebre y vmitos.  Tiene debilidad muscular intensa o no puede mover una parte del cuerpo. Esta informacin no tiene como fin reemplazar el consejo del mdico. Asegrese de hacerle al mdico cualquier pregunta que tenga. Document Released: 01/09/2008 Document Revised: 01/24/2016 Document Reviewed: 02/22/2016 Elsevier Interactive Patient Education  2018 [employer]  

## 2018-03-12 ENCOUNTER — Other Ambulatory Visit: Payer: Self-pay | Admitting: Nurse Practitioner

## 2018-03-12 DIAGNOSIS — Z1231 Encounter for screening mammogram for malignant neoplasm of breast: Secondary | ICD-10-CM

## 2018-04-09 ENCOUNTER — Ambulatory Visit
Admission: RE | Admit: 2018-04-09 | Discharge: 2018-04-09 | Disposition: A | Discharge status: Home / Self Care | Payer: No Typology Code available for payment source | Source: Ambulatory Visit | Attending: Nurse Practitioner | Admitting: Nurse Practitioner

## 2018-04-09 DIAGNOSIS — Z1231 Encounter for screening mammogram for malignant neoplasm of breast: Secondary | ICD-10-CM

## 2018-04-16 ENCOUNTER — Other Ambulatory Visit: Payer: Self-pay | Admitting: Nurse Practitioner

## 2018-06-12 ENCOUNTER — Emergency Department (HOSPITAL_COMMUNITY)
Admission: EM | Admit: 2018-06-12 | Discharge: 2018-06-12 | Disposition: A | Discharge status: Home / Self Care | Payer: Self-pay | Attending: Emergency Medicine | Admitting: Emergency Medicine

## 2018-06-12 ENCOUNTER — Other Ambulatory Visit: Payer: Self-pay

## 2018-06-12 ENCOUNTER — Emergency Department (HOSPITAL_COMMUNITY): Payer: Self-pay

## 2018-06-12 ENCOUNTER — Encounter (HOSPITAL_COMMUNITY): Payer: Self-pay

## 2018-06-12 DIAGNOSIS — R42 Dizziness and giddiness: Secondary | ICD-10-CM | POA: Insufficient documentation

## 2018-06-12 DIAGNOSIS — N390 Urinary tract infection, site not specified: Secondary | ICD-10-CM | POA: Insufficient documentation

## 2018-06-12 HISTORY — DX: Cervicalgia: M54.2

## 2018-06-12 HISTORY — DX: Gastroduodenitis, unspecified, without bleeding: K29.90

## 2018-06-12 HISTORY — DX: Other chronic pain: G89.29

## 2018-06-12 LAB — URINALYSIS, ROUTINE W REFLEX MICROSCOPIC
Bilirubin Urine: NEGATIVE
Glucose, UA: NEGATIVE mg/dL
Ketones, ur: NEGATIVE mg/dL
Nitrite: NEGATIVE
Protein, ur: NEGATIVE mg/dL
Specific Gravity, Urine: 1.014 (ref 1.005–1.030)
pH: 7 (ref 5.0–8.0)

## 2018-06-12 LAB — BASIC METABOLIC PANEL
Anion gap: 6 (ref 5–15)
BUN: 17 mg/dL (ref 6–20)
CALCIUM: 8.5 mg/dL — AB (ref 8.9–10.3)
CO2: 25 mmol/L (ref 22–32)
CREATININE: 0.94 mg/dL (ref 0.44–1.00)
Chloride: 108 mmol/L (ref 98–111)
GFR calc non Af Amer: >60 mL/min (ref >60)
Glucose, Bld: 95 mg/dL (ref 70–99)
Potassium: 3.6 mmol/L (ref 3.5–5.1)
SODIUM: 139 mmol/L (ref 135–145)

## 2018-06-12 LAB — CBC
HCT: 37.6 % (ref 36.0–46.0)
Hemoglobin: 12.1 g/dL (ref 12.0–15.0)
MCH: 28.7 pg (ref 26.0–34.0)
MCHC: 32.2 g/dL (ref 30.0–36.0)
MCV: 89.1 fL (ref 78.0–100.0)
PLATELETS: 174 10*3/uL (ref 150–400)
RBC: 4.22 MIL/uL (ref 3.87–5.11)
RDW: 14.2 % (ref 11.5–15.5)
WBC: 6.5 10*3/uL (ref 4.0–10.5)

## 2018-06-12 LAB — HCG, QUANTITATIVE, PREGNANCY: hCG, Beta Chain, Quant, S: <1 m[IU]/mL (ref <5)

## 2018-06-12 LAB — TROPONIN I

## 2018-06-12 MED ORDER — SODIUM CHLORIDE 0.9 % IV BOLUS
1000.0000 mL | Freq: Once | INTRAVENOUS | Status: AC
  Administered 2018-06-12: 1000 mL via INTRAVENOUS

## 2018-06-12 MED ORDER — CEPHALEXIN 500 MG PO CAPS: 500.0000 mg | ORAL_CAPSULE | Freq: Four times a day (QID) | ORAL | 0 refills | Status: AC

## 2018-06-12 MED ORDER — CEPHALEXIN 500 MG PO CAPS
500.0000 mg | ORAL_CAPSULE | Freq: Once | ORAL | Status: AC
  Administered 2018-06-12: 500 mg via ORAL
  Filled 2018-06-12: qty 1

## 2018-06-12 MED ORDER — DIPHENHYDRAMINE HCL 50 MG/ML IJ SOLN
12.5000 mg | Freq: Once | INTRAMUSCULAR | Status: AC
  Administered 2018-06-12: 12.5 mg via INTRAVENOUS
  Filled 2018-06-12: qty 1

## 2018-06-12 MED ORDER — MECLIZINE HCL 25 MG PO TABS: 25.0000 mg | ORAL_TABLET | Freq: Three times a day (TID) | ORAL | 0 refills | Status: AC | PRN

## 2018-06-12 MED ORDER — METOCLOPRAMIDE HCL 5 MG/ML IJ SOLN
10.0000 mg | Freq: Once | INTRAMUSCULAR | Status: AC
  Administered 2018-06-12: 10 mg via INTRAVENOUS
  Filled 2018-06-12: qty 2

## 2018-06-12 MED ORDER — KETOROLAC TROMETHAMINE 30 MG/ML IJ SOLN
30.0000 mg | Freq: Once | INTRAMUSCULAR | Status: AC
  Administered 2018-06-12: 30 mg via INTRAVENOUS
  Filled 2018-06-12: qty 1

## 2018-06-12 NOTE — ED Triage Notes (Signed)
Pt was sent by the health department. Paperwork states she is having fatigue, dizziness, an occipital headache, and chest pain for the last 3 weeks. Last night she started vomiting and having a bad headache.

## 2018-06-12 NOTE — ED Notes (Signed)
ekg given to Dr. Clayborne DanaMesner

## 2018-06-12 NOTE — Discharge Instructions (Addendum)
Take the antibiotic as directed until its finished.  Drink plenty of water.  Follow-up with the health department on Friday for recheck.  Return to the ER for any worsening symptoms.

## 2018-06-12 NOTE — ED Provider Notes (Signed)
[facility] EMERGENCY DEPARTMENT Provider Note   CSN: [id number] Arrival date & time: 06/12/18  1457     History   Chief Complaint Chief Complaint  Patient presents with  . Fatigue    HPI Gabriella Garcia is a 42 y.o. female.  The history is provided by a relative and the patient. The history is limited by a language barrier. A language interpreter was used.     Gabriella Garcia is a 42 y.o. female who presents to the Emergency Department after being evaluated at the local health department.  She complains of generalized fatigue, dizziness upon standing, posterior headache that radiates to her forehead.  And she also complains of substernal chest pain intermittently for 3 weeks.  She describes the headache as gradual in onset beginning on the night before arrival.  Headache was also associated with intermittent vomiting last evening, but denies persistent vomiting.  No neck stiffness.  No visual changes.  No recent fever or illness.  She denies history of anemia and recent tick bite.  She describes chest pain as sharp and associated with palpation.  No history of drug use or new medications.  Past Medical History:  Diagnosis Date  . Chronic abdominal pain   . Chronic neck pain   . Gastritis and duodenitis     Patient Active Problem List   Diagnosis Date Noted  . 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 Abdominal pain, chronic, epigastric 03/28/2013    Past Surgical History:  Procedure Laterality Date  . CESAREAN SECTION     x2  . COLONOSCOPY N/A 06/03/2013   Procedure: COLONOSCOPY;  Surgeon: West Bali, MD;  [facility]: [facility];  Service: Endoscopy;  Laterality: N/A;  9:45  . ESOPHAGOGASTRODUODENOSCOPY N/A 04/11/2013   [id number] PAIN DUE TO gastritis & DUODENITIS  . GIVENS CAPSULE STUDY N/A 06/05/2013   Procedure: GIVENS CAPSULE STUDY;  Surgeon: West Bali, MD;  Location: [facility];  Service: Endoscopy;  Laterality: N/A;  7:30     OB History     None      Home Medications    Prior to Admission medications   Not on File    Family History Family History  Problem Relation Age of Onset  . Colon cancer Neg Hx     Social History Social History   Tobacco Use  . Smoking status: Never Smoker  . Smokeless tobacco: Never Used  Substance Use Topics  . Alcohol use: No  . Drug use: No     Allergies   Patient has no known allergies.   Review of Systems Review of Systems  Constitutional: Negative for appetite change, fatigue and fever.  HENT: Negative for sore throat and trouble swallowing.   Eyes: Negative for visual disturbance.  Respiratory: Negative for cough, chest tightness, shortness of breath and wheezing.   Cardiovascular: Positive for chest pain.  Gastrointestinal: Positive for nausea and vomiting. Negative for abdominal pain and diarrhea.  Genitourinary: Positive for decreased urine volume. Negative for dysuria, flank pain, vaginal bleeding and vaginal discharge.  Musculoskeletal: Negative for arthralgias, myalgias, neck pain and neck stiffness.  Skin: Negative for rash.  Neurological: Positive for dizziness, weakness and headaches. Negative for syncope, facial asymmetry, speech difficulty and numbness.  Psychiatric/Behavioral: Negative for confusion.     Physical Exam Updated Vital Signs BP 112/70   Pulse (!) 49   Temp (!) 97.4 F (36.3 C) (Oral)   Resp 19   Wt 86.9 kg   LMP 06/06/2018  SpO2 100%   BMI 33.40 kg/m   Physical Exam  Constitutional: She is oriented to person, place, and time. She appears well-developed and well-nourished. No distress.  HENT:  Head: Normocephalic and atraumatic.  Mouth/Throat: Oropharynx is clear and moist.  Eyes: Pupils are equal, round, and reactive to light. Conjunctivae and EOM are normal.  Neck: Normal range of motion and phonation normal. Neck supple. No JVD present. No spinous process tenderness and no muscular tenderness present. No neck rigidity. No  Kernig's sign noted.  Cardiovascular: Normal rate, regular rhythm and intact distal pulses.  Pulmonary/Chest: Effort normal and breath sounds normal. No respiratory distress. She exhibits tenderness (Substernal chest pain that is reproducible with palpation.  No crepitus.).  Abdominal: Soft. She exhibits no distension and no mass. There is no tenderness. There is no guarding.  Musculoskeletal: Normal range of motion.  Neurological: She is alert and oriented to person, place, and time. She has normal strength. No cranial nerve deficit or sensory deficit. She exhibits normal muscle tone. Coordination and gait normal. GCS eye subscore is 4. GCS verbal subscore is 5. GCS motor subscore is 6.  Reflex Scores:      Tricep reflexes are 2+ on the right side and 2+ on the left side.      Bicep reflexes are 2+ on the right side and 2+ on the left side. CN III-XII grossly intact, speech clear, no pronator drift, normal heel shin testing.  Grip strength or strong and symmetrical.  Skin: Skin is warm and dry. Capillary refill takes less than 2 seconds. No rash noted.  Psychiatric: She has a normal mood and affect. Thought content normal.  Nursing note and vitals reviewed.    ED Treatments / Results  Labs (all labs ordered are listed, but only abnormal results are displayed) Labs Reviewed  BASIC METABOLIC PANEL - Abnormal; Notable for the following components:      Result Value   Calcium 8.5 (*)    All other components within normal limits  CBC  TROPONIN I  HCG, QUANTITATIVE, PREGNANCY  URINALYSIS, ROUTINE W REFLEX MICROSCOPIC    EKG EKG Interpretation  Date/Time:  Wednesday June 12 2018 15:09:56 EDT Ventricular Rate:  58 PR Interval:  162 QRS Duration: 88 QT Interval:  432 QTC Calculation: 424 R Axis:   79 Text Interpretation:  Sinus bradycardia Otherwise normal ECG No old tracing to compare Confirmed by Samuel JesterMcManus, Kathleen [phone]([id number]) on 06/12/2018 6:28:51 PM   Radiology Dg Chest 2  View  Result Date: 06/12/2018 CLINICAL DATA:  Fatigue, dizziness, occipital headache and chest pain for the last 3 weeks, onset of vomiting and bad headache last night EXAM: CHEST - 2 VIEW COMPARISON:  None FINDINGS: Normal heart size, mediastinal contours, and pulmonary vascularity. Lungs clear. No pleural effusion or pneumothorax. Bones unremarkable. IMPRESSION: Normal exam. Electronically Signed   By: Ulyses SouthwardMark  Boles M.D.   On: 06/12/2018 16:05   Ct Head Wo Contrast  Result Date: 06/12/2018 CLINICAL DATA:  Occipital headache, fatigue and dizziness for 3 weeks. Vomiting. EXAM: CT HEAD WITHOUT CONTRAST TECHNIQUE: Contiguous axial images were obtained from the base of the skull through the vertex without intravenous contrast. COMPARISON:  CT neck May 04, 2015 FINDINGS: BRAIN: No intraparenchymal hemorrhage, mass effect nor midline shift. The ventricles and sulci are normal. No acute large vascular territory infarcts. No abnormal extra-axial fluid collections. Basal cisterns are patent. VASCULAR: Unremarkable. SKULL/SOFT TISSUES: No skull fracture. No significant soft tissue swelling. ORBITS/SINUSES: The included ocular globes and orbital contents  are normal.Trace paranasal sinus mucosal thickening. Mastoid air cells are well aerated. OTHER: None. IMPRESSION: Normal noncontrast CT HEAD. Electronically Signed   By: Awilda Metro M.D.   On: 06/12/2018 18:28    Procedures Procedures (including critical care time)  Medications Ordered in ED Medications  sodium chloride 0.9 % bolus 1,000 mL (1,000 mLs Intravenous New Bag/Given 06/12/18 1734)  ketorolac (TORADOL) 30 MG/ML injection 30 mg (30 mg Intravenous Given 06/12/18 1734)  metoCLOPramide (REGLAN) injection 10 mg (10 mg Intravenous Given 06/12/18 1734)  diphenhydrAMINE (BENADRYL) injection 12.5 mg (12.5 mg Intravenous Given 06/12/18 1734)     Initial Impression / Assessment and Plan / ED Course  I have reviewed the triage vital signs and the nursing  notes.  Pertinent labs & imaging results that were available during my care of the patient were reviewed by me and considered in my medical decision making (see chart for details).      Patient resting comfortably.  Headache improved after medication and IV fluids..  Vital signs remained stable.  Attempting oral fluid challenge.  No focal neuro deficits, no nuchal rigidity.  Headache described as gradual in onset, clinically low suspicion for [facility]  On recheck, work-up reassuring.  Urinalysis shows possible UTI.  Will treat with antibiotics and urine culture pending.  patient feeling better, has tolerated oral fluids.  She appears appropriate for discharge home, agrees to close PCP follow-up.  Return precautions discussed.  All questions answered   Final Clinical Impressions(s) / ED Diagnoses   Final diagnoses:  Urinary tract infection in female  Dizziness on standing    ED Discharge Orders    None       Pauline Aus, PA-C 06/14/18 1905    Samuel Jester, DO 06/14/18 2326

## 2018-06-12 NOTE — ED Notes (Signed)
Laying: 110/71 (54)   Sitting: 121/74 (58)  Standing: 121/76 (48)

## 2018-06-14 LAB — URINE CULTURE

## 2018-12-12 ENCOUNTER — Ambulatory Visit: Payer: Self-pay | Admitting: Physician Assistant

## 2018-12-24 ENCOUNTER — Encounter: Payer: Self-pay | Admitting: Physician Assistant

## 2021-11-07 ENCOUNTER — Ambulatory Visit (INDEPENDENT_AMBULATORY_CARE_PROVIDER_SITE_OTHER): Payer: Self-pay

## 2021-11-07 ENCOUNTER — Ambulatory Visit
Admission: EM | Admit: 2021-11-07 | Discharge: 2021-11-07 | Disposition: A | Discharge status: Home / Self Care | Payer: Self-pay | Attending: Family Medicine | Admitting: Family Medicine

## 2021-11-07 ENCOUNTER — Other Ambulatory Visit: Payer: Self-pay

## 2021-11-07 DIAGNOSIS — M25561 Pain in right knee: Secondary | ICD-10-CM

## 2021-11-07 DIAGNOSIS — M1711 Unilateral primary osteoarthritis, right knee: Secondary | ICD-10-CM

## 2021-11-07 MED ORDER — PREDNISONE 20 MG PO TABS: 40.0000 mg | ORAL_TABLET | Freq: Every day | ORAL | 0 refills | Status: AC

## 2021-11-07 NOTE — ED Provider Notes (Signed)
[facility]    CSN: [id number] Arrival date & time: 11/07/21  1023      History   Chief Complaint Chief Complaint  Patient presents with   Knee Pain    Right knee pain    HPI Gabriella Garcia is a 46 y.o. female.   Medical interpreter declined today, requesting that her son who is present with her today to translate for her.  She states for the past 2 weeks she has been having trouble walking due to right lateral knee pain that extends down the anterior and lateral lower leg.  She states rest relieves the pain, walking, moving the knee and ankle makes it worse.  No swelling, redness, known injury, numbness, tingling, weakness.  Has not been trying anything for pain thus far.  No known past orthopedic injuries to the area.   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      Home Medications    Prior to Admission medications   Medication Sig Start Date End Date Taking? Authorizing Provider  predniSONE (DELTASONE) 20 MG tablet Take 2 tablets (40 mg total) by mouth daily with breakfast. 11/07/21  Yes Volney American, PA-C  cephALEXin (KEFLEX) 500 MG capsule Take 1 capsule (500 mg total) by mouth 4  (four) times daily. For 7 days 06/12/18   Kem Parkinson, PA-C  meclizine (ANTIVERT) 25 MG tablet Take 1 tablet (25 mg total) by mouth 3 (three) times daily as needed for dizziness. 06/12/18   Kem Parkinson, PA-C    Family History Family History  Problem Relation Age of Onset   Colon cancer Neg Hx     Social History Social History   Tobacco Use   Smoking status: Never   Smokeless tobacco: Never  Vaping Use   Vaping Use: Never used  Substance Use Topics   Alcohol use: No   Drug use: No     Allergies   Patient has no known allergies.   Review of Systems Review of Systems Per HPI  Physical Exam Triage Vital Signs ED Triage Vitals  Enc Vitals Group     BP 11/07/21 1048 124/83     Pulse Rate 11/07/21 1048 (!) 58     Resp 11/07/21 1048 16     Temp 11/07/21 1048 (!) 97.3 F (36.3 C)     Temp Source 11/07/21 1048 Oral     SpO2 11/07/21 1048 96 %     Weight --      Height --      Head Circumference --      Peak Flow --      Pain Score 11/07/21  1044 8     Pain Loc --      Pain Edu? --      Excl. in [place]? --    No data found.  Updated Vital Signs BP 124/83 (BP Location: Right Arm)    Pulse (!) 58    Temp (!) 97.3 F (36.3 C) (Oral)    Resp 16    LMP 06/06/2018    SpO2 96%   Visual Acuity Right Eye Distance:   Left Eye Distance:   Bilateral Distance:    Right Eye Near:   Left Eye Near:    Bilateral Near:     Physical Exam Vitals and nursing note reviewed.  Constitutional:      Appearance: Normal appearance. She is not ill-appearing.  HENT:     Head: Atraumatic.  Eyes:     Extraocular Movements: Extraocular movements intact.     Conjunctiva/sclera: Conjunctivae normal.  Cardiovascular:     Rate and Rhythm: Normal rate and regular rhythm.     Heart sounds: Normal heart sounds.  Pulmonary:     Effort: Pulmonary effort is normal.     Breath sounds: Normal breath sounds.  Musculoskeletal:        General: Tenderness present. No swelling, deformity or  signs of injury. Normal range of motion.     Cervical back: Normal range of motion and neck supple.     Comments: Negative Homans' sign, negative squeeze test.  Range of motion in the right knee intact.  Tender to palpation over right lateral knee and extending down the anterior lateral portion of the right lower leg.  Skin:    General: Skin is warm and dry.     Findings: No bruising or erythema.  Neurological:     Mental Status: She is alert and oriented to person, place, and time.     Comments: Right lower leg neurovascularly intact  Psychiatric:        Mood and Affect: Mood normal.        Thought Content: Thought content normal.        Judgment: Judgment normal.     UC Treatments / Results  Labs (all labs ordered are listed, but only abnormal results are displayed) Labs Reviewed - No data to display  EKG   Radiology DG Knee Complete 4 Views Right  Result Date: 11/07/2021 CLINICAL DATA:  Lateral right knee pain. EXAM: RIGHT KNEE - COMPLETE 4+ VIEW COMPARISON:  None. FINDINGS: Mild medial compartment joint space narrowing and peripheral osteophytosis degenerative change. There are 2 ossicles at the superolateral aspect of the patella, likely normal variant bipartite morphology. Mild superior patellar degenerative osteophytosis. Tiny joint effusion. No acute fracture is seen. No dislocation. IMPRESSION: Mild medial compartment osteoarthritis. Bipartite patella. Electronically Signed   By: Yvonne Kendall M.D.   On: 11/07/2021 11:43    Procedures Procedures (including critical care time)  Medications Ordered in UC Medications - No data to display  Initial Impression / Assessment and Plan / UC Course  I have reviewed the triage vital signs and the nursing notes.  Pertinent labs & imaging results that were available during my care of the patient were reviewed by me and considered in my medical decision making (see chart for details).     X-ray of the right knee showing  osteoarthritis changes.  Suspect inflammatory flare from this causing her current pain.  Treat with prednisone, compression sleeve, rest, elevation.  Return for acutely worsening symptoms.  Final Clinical Impressions(s) / UC Diagnoses  Final diagnoses:  Acute pain of right knee  Primary osteoarthritis of right knee   Discharge Instructions   None    ED Prescriptions     Medication Sig Dispense Auth. Provider   predniSONE (DELTASONE) 20 MG tablet Take 2 tablets (40 mg total) by mouth daily with breakfast. 10 tablet Volney American, [place]      PDMP not reviewed this encounter.   Volney American, [place] 11/07/21 1205

## 2021-11-07 NOTE — ED Triage Notes (Signed)
Patient states her right knee is in pain for 2 weeks  Patient states she is having trouble walking  Patient has not tried ice or elevation  Patient denies Injury  Patient denies pain meds

## 2022-09-14 ENCOUNTER — Ambulatory Visit
Admission: EM | Admit: 2022-09-14 | Discharge: 2022-09-14 | Disposition: A | Discharge status: Home / Self Care | Payer: Self-pay | Attending: Nurse Practitioner | Admitting: Nurse Practitioner

## 2022-09-14 DIAGNOSIS — H1013 Acute atopic conjunctivitis, bilateral: Secondary | ICD-10-CM

## 2022-09-14 DIAGNOSIS — J309 Allergic rhinitis, unspecified: Secondary | ICD-10-CM

## 2022-09-14 MED ORDER — CETIRIZINE HCL 10 MG PO TABS: 10.0000 mg | ORAL_TABLET | Freq: Every day | ORAL | 0 refills | Status: AC

## 2022-09-14 MED ORDER — OLOPATADINE HCL 0.1 % OP SOLN: 1.0000 [drp] | Freq: Two times a day (BID) | OPHTHALMIC | 0 refills | Status: AC

## 2022-09-14 MED ORDER — FLUTICASONE PROPIONATE 50 MCG/ACT NA SUSP: 2.0000 | Freq: Every day | NASAL | 0 refills | Status: DC

## 2022-09-14 NOTE — Discharge Instructions (Addendum)
Use medication as prescribed. Increase fluids and allow for plenty of rest. May take over-the-counter Tylenol or ibuprofen as needed for pain, fever, or general discomfort. For nasal congestion throughout the day, you can use normal saline nasal spray which she can purchase over-the-counter. If your allergy symptoms worsen, consider wearing a mask when you are outside or out in public. Follow-up if you experience new symptoms such as fever, chills, worsening headache, nasal congestion, or other concerns. Follow-up as needed.

## 2022-09-14 NOTE — ED Provider Notes (Signed)
[facility]    CSN: [id number] Arrival date & time: 09/14/22  1518      History   Chief Complaint Chief Complaint  Patient presents with   Eye Problem    HPI Gabriella Garcia is a 46 y.o. female.   The history is provided by the patient.   Patient presents for a 1 week history of itchy, watery, and tearing eyes along with eye redness, nasal congestion, and head pressure.  Patient denies fever, chills, sore throat, ear pain, cough, abdominal pain, nausea, vomiting, or diarrhea.  Reports that her eye watering has become constant over the last several days.  Patient reports that she has not taken any medication for her symptoms.  She denies a history of seasonal allergies.  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      Home Medications    Prior to Admission medications   Medication Sig Start Date End Date Taking? Authorizing Provider  cetirizine (ZYRTEC) 10 MG tablet Take 1 tablet (10 mg total) by mouth daily. 09/14/22  Yes Kiearra Oyervides-Warren, Sadie Haber, NP  fluticasone (FLONASE) 50 MCG/ACT nasal spray Place 2 sprays into both nostrils daily. 09/14/22  Yes Charletha Dalpe-Warren, Sadie Haber, NP  olopatadine  (PATANOL) 0.1 % ophthalmic solution Place 1 drop into both eyes 2 (two) times daily. 09/14/22  Yes Clem Wisenbaker-Warren, Sadie Haber, NP  cephALEXin (KEFLEX) 500 MG capsule Take 1 capsule (500 mg total) by mouth 4 (four) times daily. For 7 days 06/12/18   Pauline Aus, PA-C  meclizine (ANTIVERT) 25 MG tablet Take 1 tablet (25 mg total) by mouth 3 (three) times daily as needed for dizziness. 06/12/18   Triplett, Tammy, PA-C  predniSONE (DELTASONE) 20 MG tablet Take 2 tablets (40 mg total) by mouth daily with breakfast. 11/07/21   Particia Nearing, PA-C    Family History Family History  Problem Relation Age of Onset   Colon cancer Neg Hx     Social History Social History   Tobacco Use   Smoking status: Never   Smokeless tobacco: Never  Vaping Use   Vaping Use: Never used  Substance Use Topics   Alcohol use: No   Drug use: Never     Allergies   Patient has no known allergies.   Review of Systems Review of Systems Per HPI  Physical Exam Triage Vital Signs ED Triage Vitals  Enc Vitals Group     BP 09/14/22 1530 (!) 152/89     Pulse Rate 09/14/22 1530 60     Resp 09/14/22 1530 16     Temp 09/14/22 1530 (!) 97.5 F (36.4 C)  Temp Source 09/14/22 1530 Oral     SpO2 09/14/22 1530 96 %     Weight --      Height --      Head Circumference --      Peak Flow --      Pain Score 09/14/22 1532 0     Pain Loc --      Pain Edu? --      Excl. in [place]? --    No data found.  Updated Vital Signs BP (!) 152/89 (BP Location: Right Arm)   Pulse 60   Temp (!) 97.5 F (36.4 C) (Oral)   Resp 16   LMP 06/06/2018   SpO2 96%   Visual Acuity Right Eye Distance:   Left Eye Distance:   Bilateral Distance:    Right Eye Near:   Left Eye Near:    Bilateral Near:     Physical Exam Vitals and nursing note reviewed.  Constitutional:      General: She is not in acute distress.    Appearance: Normal appearance.  HENT:     Head: Normocephalic.     Right Ear: Tympanic membrane, ear  canal and external ear normal.     Left Ear: Tympanic membrane, ear canal and external ear normal.     Nose: Congestion present.     Right Turbinates: Enlarged and swollen.     Left Turbinates: Enlarged and swollen.     Right Sinus: No maxillary sinus tenderness or frontal sinus tenderness.     Left Sinus: No maxillary sinus tenderness or frontal sinus tenderness.     Mouth/Throat:     Lips: Pink.     Mouth: Mucous membranes are moist.     Pharynx: Oropharynx is clear. Uvula midline. Posterior oropharyngeal erythema present. No pharyngeal swelling or uvula swelling.     Tonsils: No tonsillar exudate.  Eyes:     Extraocular Movements: Extraocular movements intact.     Pupils: Pupils are equal, round, and reactive to light.  Cardiovascular:     Rate and Rhythm: Normal rate and regular rhythm.     Pulses: Normal pulses.     Heart sounds: Normal heart sounds.  Pulmonary:     Effort: Pulmonary effort is normal. No respiratory distress.     Breath sounds: Normal breath sounds. No stridor. No wheezing, rhonchi or rales.  Abdominal:     General: Bowel sounds are normal.     Palpations: Abdomen is soft.     Tenderness: There is no guarding.  Musculoskeletal:     Cervical back: Normal range of motion.  Lymphadenopathy:     Cervical: No cervical adenopathy.  Skin:    General: Skin is warm and dry.  Neurological:     General: No focal deficit present.     Mental Status: She is alert and oriented to person, place, and time.  Psychiatric:        Mood and Affect: Mood normal.        Behavior: Behavior normal.      UC Treatments / Results  Labs (all labs ordered are listed, but only abnormal results are displayed) Labs Reviewed - No data to display  EKG   Radiology No results found.  Procedures Procedures (including critical care time)  Medications Ordered in UC Medications - No data to display  Initial Impression / Assessment and Plan / UC Course  I have reviewed the  triage vital signs and the nursing notes.  Pertinent labs & imaging results  that were available during my care of the patient were reviewed by me and considered in my medical decision making (see chart for details).  Patient presents with a 1 week history of allergic rhinitis symptoms.  Patient is well-appearing, she is in no acute distress, she is mildly hypertensive, but vital signs are otherwise stable.  On exam, she has injection of her bilateral conjunctiva, with tearing present.  She also has noticeable nasal congestion.  Symptoms are consistent with allergic rhinitis.  Will treat her symptoms with cetirizine 10 mg, fluticasone 50 mcg nasal spray, and for allergic conjunctivitis, will treat with Pataday 0.1% eyedrops.  Supportive care recommendations were provided to the patient to include use of normal saline nasal spray, Tylenol or ibuprofen as needed for pain or discomfort, and consideration of wearing a mask when she is outside.  Patient verbalizes understanding.  All questions were answered.  Patient is stable for discharge. Final Clinical Impressions(s) / UC Diagnoses   Final diagnoses:  Allergic rhinitis, unspecified seasonality, unspecified trigger  Allergic conjunctivitis of both eyes     Discharge Instructions      Use medication as prescribed. Increase fluids and allow for plenty of rest. May take over-the-counter Tylenol or ibuprofen as needed for pain, fever, or general discomfort. For nasal congestion throughout the day, you can use normal saline nasal spray which she can purchase over-the-counter. If your allergy symptoms worsen, consider wearing a mask when you are outside or out in public. Follow-up if you experience new symptoms such as fever, chills, worsening headache, nasal congestion, or other concerns. Follow-up as needed.     ED Prescriptions     Medication Sig Dispense Auth. Provider   fluticasone (FLONASE) 50 MCG/ACT nasal spray Place 2 sprays into both  nostrils daily. 16 g Jilian West-Warren, Sadie Haber, NP   cetirizine (ZYRTEC) 10 MG tablet Take 1 tablet (10 mg total) by mouth daily. 30 tablet Erion Weightman-Warren, Sadie Haber, NP   olopatadine (PATANOL) 0.1 % ophthalmic solution Place 1 drop into both eyes 2 (two) times daily. 5 mL Wang [place]-Warren, Sadie Haber, NP      PDMP not reviewed this encounter.   Abran Cantor, NP 09/14/22 1605

## 2022-09-14 NOTE — ED Triage Notes (Signed)
Pt reports watery eyes, redness in eyes, itchiness in eye, nasal congestion, head pressure x 1 week.

## 2022-11-25 ENCOUNTER — Ambulatory Visit
Admission: EM | Admit: 2022-11-25 | Discharge: 2022-11-25 | Disposition: A | Discharge status: Home / Self Care | Payer: Self-pay | Attending: Family Medicine | Admitting: Family Medicine

## 2022-11-25 DIAGNOSIS — R059 Cough, unspecified: Secondary | ICD-10-CM | POA: Insufficient documentation

## 2022-11-25 DIAGNOSIS — J069 Acute upper respiratory infection, unspecified: Secondary | ICD-10-CM | POA: Insufficient documentation

## 2022-11-25 DIAGNOSIS — U071 COVID-19: Secondary | ICD-10-CM | POA: Insufficient documentation

## 2022-11-25 LAB — POCT RAPID STREP A (OFFICE): Rapid Strep A Screen: NEGATIVE

## 2022-11-25 MED ORDER — PROMETHAZINE-DM 6.25-15 MG/5ML PO SYRP: 5.0000 mL | ORAL_SOLUTION | Freq: Four times a day (QID) | ORAL | 0 refills | Status: DC | PRN

## 2022-11-25 MED ORDER — FLUTICASONE PROPIONATE 50 MCG/ACT NA SUSP: 1.0000 | Freq: Two times a day (BID) | NASAL | 2 refills | Status: DC

## 2022-11-25 NOTE — ED Provider Notes (Signed)
[facility]    CSN: [id number] Arrival date & time: 11/25/22  1113      History   Chief Complaint Chief Complaint  Patient presents with   Sore Throat    HPI Gabriella Garcia is a 47 y.o. female.   Medical interpreter utilized today to facilitate visit with patient's consent.  She states she has had a sore throat, cough, headache, fatigue, congestion for 3 days now.  Denies chest pain, shortness of breath, fever, body aches, abdominal pain, nausea vomiting or diarrhea.  So far taking ibuprofen with minimal relief of symptoms.  Multiple sick contacts recently.  No known pertinent chronic medical problems.    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      Home Medications    Prior to Admission medications   Medication Sig Start Date End Date Taking? Authorizing Provider  fluticasone (FLONASE) 50 MCG/ACT nasal spray Place 1 spray into both nostrils 2 (two) times daily. 11/25/22  Yes Volney American, PA-C  promethazine-dextromethorphan (PROMETHAZINE-DM) 6.25-15 MG/5ML syrup Take 5 mLs by mouth 4 (four) times daily as needed. 11/25/22  Yes Volney American, PA-C  cephALEXin (KEFLEX) 500 MG  capsule Take 1 capsule (500 mg total) by mouth 4 (four) times daily. For 7 days 06/12/18   Kem Parkinson, PA-C  cetirizine (ZYRTEC) 10 MG tablet Take 1 tablet (10 mg total) by mouth daily. 09/14/22   Leath-Warren, Alda Lea, NP  fluticasone (FLONASE) 50 MCG/ACT nasal spray Place 2 sprays into both nostrils daily. 09/14/22   Leath-Warren, Alda Lea, NP  meclizine (ANTIVERT) 25 MG tablet Take 1 tablet (25 mg total) by mouth 3 (three) times daily as needed for dizziness. 06/12/18   Triplett, Tammy, PA-C  olopatadine (PATANOL) 0.1 % ophthalmic solution Place 1 drop into both eyes 2 (two) times daily. 09/14/22   Leath-Warren, Alda Lea, NP  predniSONE (DELTASONE) 20 MG tablet Take 2 tablets (40 mg total) by mouth daily with breakfast. 11/07/21   Volney American, PA-C    Family History Family History  Problem Relation Age of Onset   Colon cancer Neg Hx     Social History Social History   Tobacco Use   Smoking status: Never   Smokeless tobacco: Never  Vaping Use   Vaping Use: Never used  Substance Use Topics   Alcohol use: No   Drug use: Never     Allergies   Patient has no known allergies.   Review of Systems Review of Systems Per HPI  Physical Exam Triage Vital Signs ED Triage Vitals  Enc Vitals Group     BP 11/25/22 1221 (!) 145/86  Pulse Rate 11/25/22 1221 (!) 59     Resp 11/25/22 1221 20     Temp 11/25/22 1221 97.9 F (36.6 C)     Temp Source 11/25/22 1221 Oral     SpO2 11/25/22 1221 96 %     Weight --      Height --      Head Circumference --      Peak Flow --      Pain Score 11/25/22 1222 8     Pain Loc --      Pain Edu? --      Excl. in [place]? --    No data found.  Updated Vital Signs BP (!) 145/86 (BP Location: Right Arm)   Pulse (!) 59   Temp 97.9 F (36.6 C) (Oral)   Resp 20   LMP 11/16/2018 (Exact Date)   SpO2 96%   Visual Acuity Right Eye Distance:   Left Eye Distance:   Bilateral Distance:    Right Eye Near:   Left Eye Near:     Bilateral Near:     Physical Exam Vitals and nursing note reviewed.  Constitutional:      Appearance: Normal appearance.  HENT:     Head: Atraumatic.     Right Ear: Tympanic membrane and external ear normal.     Left Ear: Tympanic membrane and external ear normal.     Nose: Rhinorrhea present.     Mouth/Throat:     Mouth: Mucous membranes are moist.     Pharynx: Posterior oropharyngeal erythema present.  Eyes:     Extraocular Movements: Extraocular movements intact.     Conjunctiva/sclera: Conjunctivae normal.  Cardiovascular:     Rate and Rhythm: Normal rate and regular rhythm.     Heart sounds: Normal heart sounds.  Pulmonary:     Effort: Pulmonary effort is normal.     Breath sounds: Normal breath sounds. No wheezing or rales.  Musculoskeletal:        General: Normal range of motion.     Cervical back: Normal range of motion and neck supple.  Skin:    General: Skin is warm and dry.  Neurological:     Mental Status: She is alert and oriented to person, place, and time.  Psychiatric:        Mood and Affect: Mood normal.        Thought Content: Thought content normal.      UC Treatments / Results  Labs (all labs ordered are listed, but only abnormal results are displayed) Labs Reviewed  SARS CORONAVIRUS 2 (TAT 6-24 HRS)  POCT RAPID STREP A (OFFICE)    EKG   Radiology No results found.  Procedures Procedures (including critical care time)  Medications Ordered in UC Medications - No data to display  Initial Impression / Assessment and Plan / UC Course  I have reviewed the triage vital signs and the nursing notes.  Pertinent labs & imaging results that were available during my care of the patient were reviewed by me and considered in my medical decision making (see chart for details).     Vitals and exam overall reassuring and suggestive of a viral upper respiratory infection.  Rapid strep negative, COVID testing pending.  Treat with Phenergan DM,  Flonase, supportive over-the-counter medications and home care.  Return for worsening symptoms.  Final Clinical Impressions(s) / UC Diagnoses   Final diagnoses:  Viral URI with cough   Discharge Instructions   None    ED Prescriptions  Medication Sig Dispense Auth. Provider   promethazine-dextromethorphan (PROMETHAZINE-DM) 6.25-15 MG/5ML syrup Take 5 mLs by mouth 4 (four) times daily as needed. 100 mL Volney American, PA-C   fluticasone [facility]) 50 MCG/ACT nasal spray Place 1 spray into both nostrils 2 (two) times daily. 16 g Volney American, [place]      PDMP not reviewed this encounter.   Volney American, [place] 11/25/22 1311

## 2022-11-25 NOTE — ED Triage Notes (Signed)
Pt reports with a sore throat, cough, and headache x 3 days.

## 2022-11-26 LAB — SARS CORONAVIRUS 2 (TAT 6-24 HRS): SARS Coronavirus 2: POSITIVE — AB

## 2023-04-27 ENCOUNTER — Encounter: Payer: Self-pay | Admitting: *Deleted

## 2023-12-12 IMAGING — DX DG KNEE COMPLETE 4+V*R*
4 series · 4 of 4 positions shown · non-contrast
Comparison: None.

CLINICAL DATA: Lateral right knee pain.

EXAM:
RIGHT KNEE - COMPLETE 4+ VIEW

[knee ap]
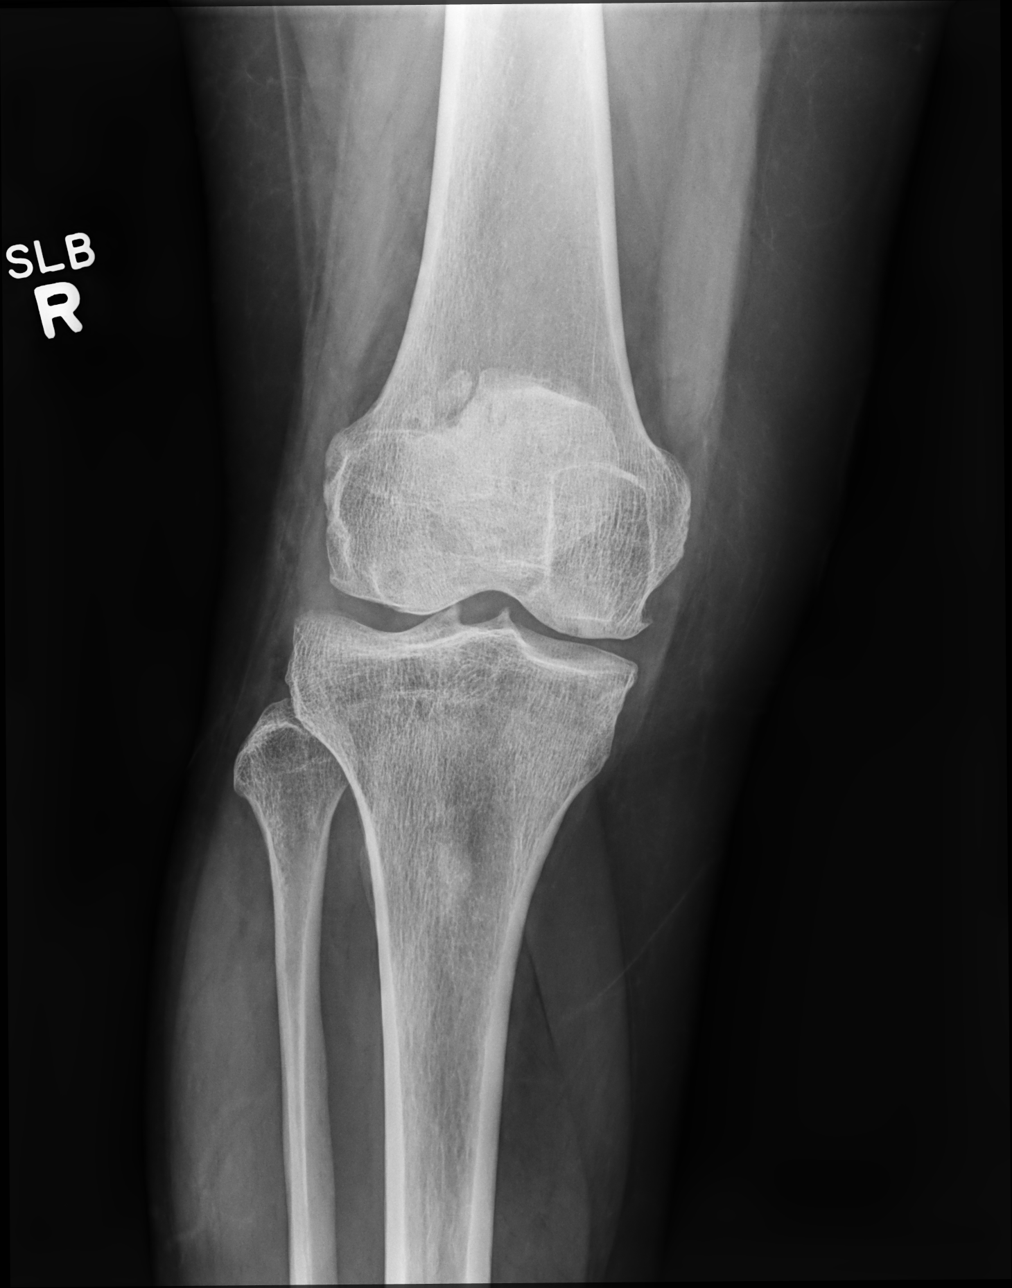

[knee mlo (1 of 2)]
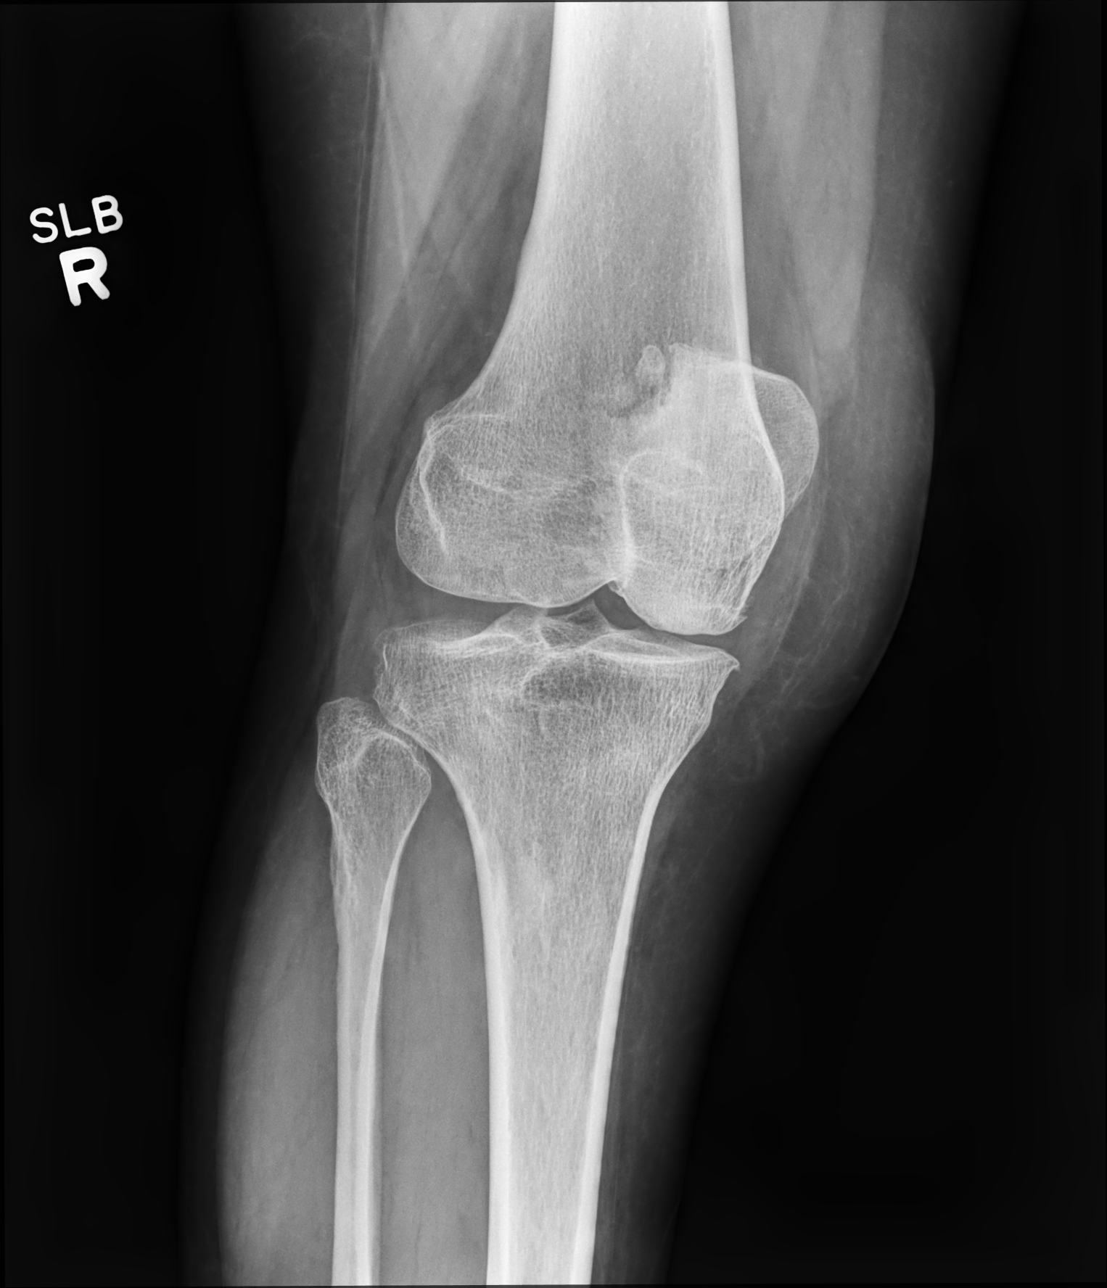

[knee mlo (2 of 2)]
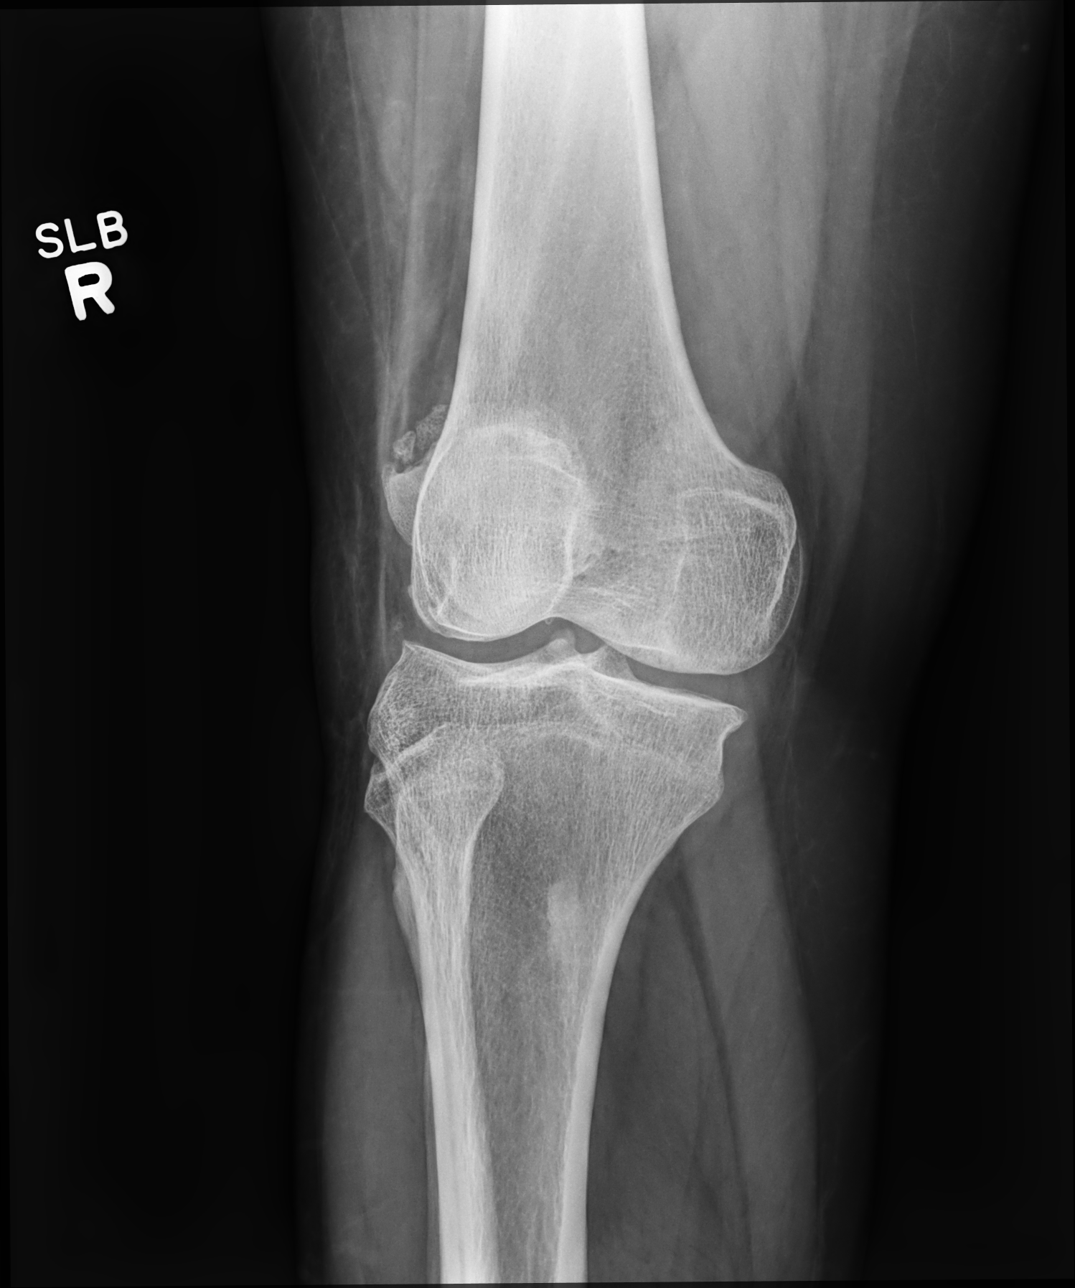

[knee lat]
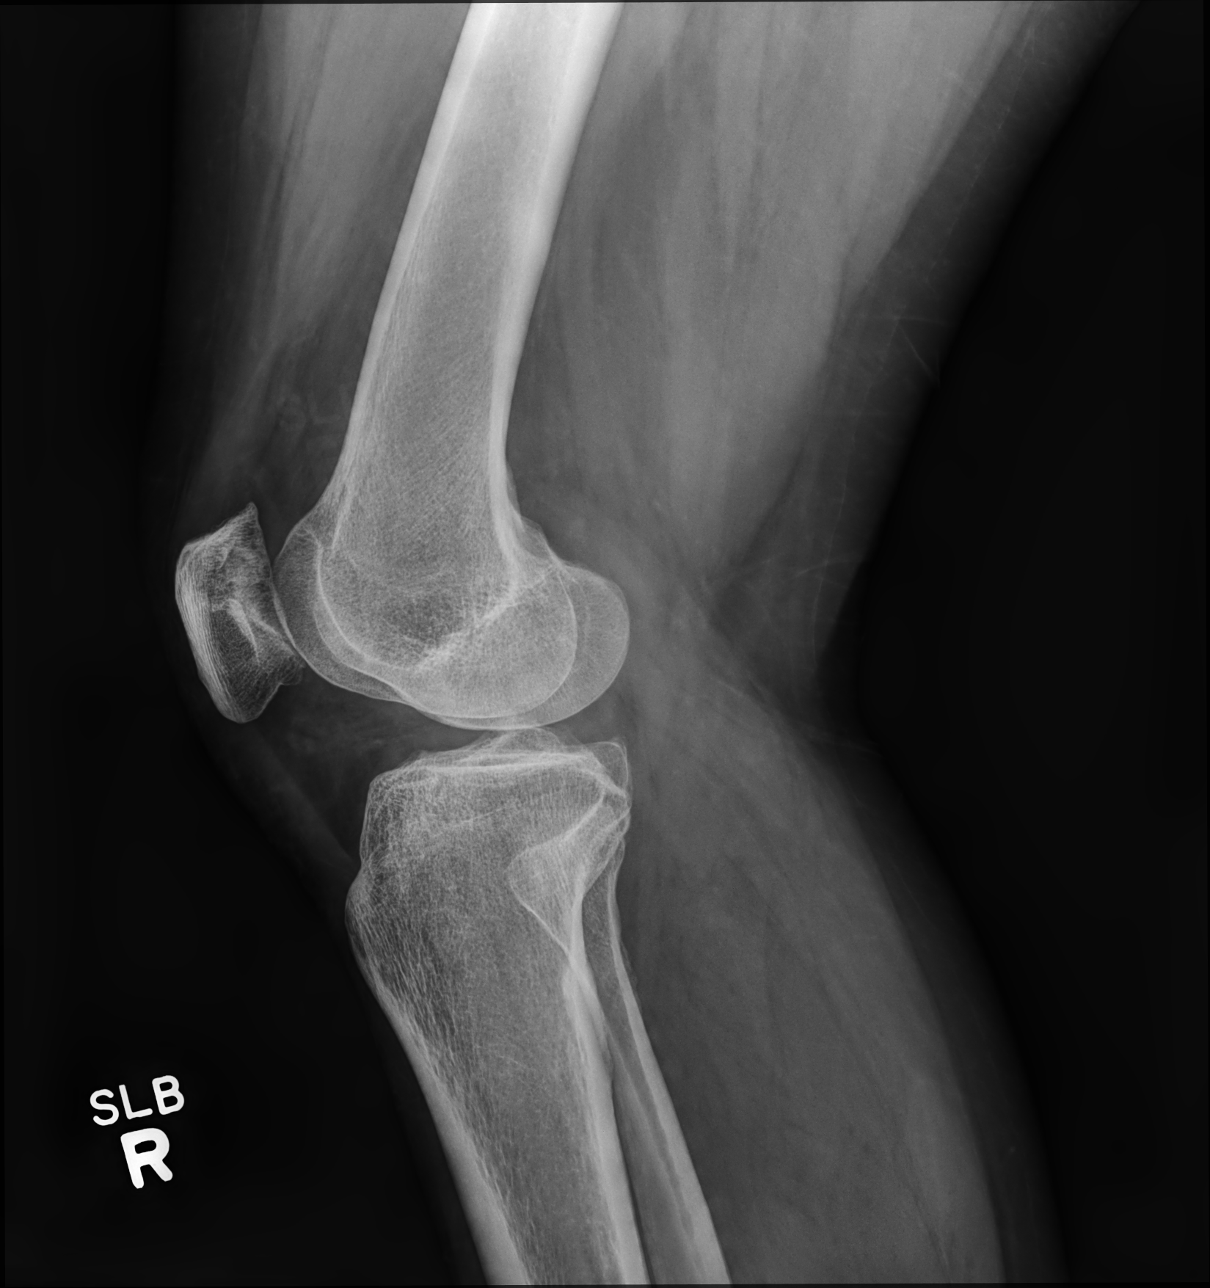

[4 of 4 positions shown; findings below may reference images not displayed]

FINDINGS: Mild medial compartment joint space narrowing and peripheral
osteophytosis degenerative change. There are 2 ossicles at the
superolateral aspect of the patella, likely normal variant bipartite
morphology. Mild superior patellar degenerative osteophytosis. Tiny
joint effusion. No acute fracture is seen. No dislocation.
IMPRESSION: Mild medial compartment osteoarthritis.

Bipartite patella.

## 2023-12-17 ENCOUNTER — Ambulatory Visit
Admission: EM | Admit: 2023-12-17 | Discharge: 2023-12-17 | Disposition: A | Discharge status: Home / Self Care | Payer: Self-pay | Attending: Family Medicine | Admitting: Family Medicine

## 2023-12-17 DIAGNOSIS — J208 Acute bronchitis due to other specified organisms: Secondary | ICD-10-CM

## 2023-12-17 LAB — POCT RAPID STREP A (OFFICE): Rapid Strep A Screen: NEGATIVE

## 2023-12-17 LAB — POC COVID19/FLU A&B COMBO
Covid Antigen, POC: NEGATIVE
Influenza A Antigen, POC: NEGATIVE
Influenza B Antigen, POC: NEGATIVE

## 2023-12-17 MED ORDER — FLUTICASONE PROPIONATE 50 MCG/ACT NA SUSP: 1.0000 | Freq: Two times a day (BID) | NASAL | 2 refills | Status: DC

## 2023-12-17 MED ORDER — PROMETHAZINE-DM 6.25-15 MG/5ML PO SYRP: 5.0000 mL | ORAL_SOLUTION | Freq: Four times a day (QID) | ORAL | 0 refills | Status: DC | PRN

## 2023-12-17 MED ORDER — PREDNISONE 20 MG PO TABS: 40.0000 mg | ORAL_TABLET | Freq: Every day | ORAL | 0 refills | Status: AC

## 2023-12-17 NOTE — ED Triage Notes (Signed)
 Cough, sore throat, congestion, ear pain x 4 days. Taking ibuprofen and tylenol.

## 2023-12-21 NOTE — ED Provider Notes (Signed)
 [facility]    CSN: [id number] Arrival date & time: 12/17/23  1633      History   Chief Complaint Chief Complaint  Patient presents with   Sore Throat   Cough    HPI Neysa Arts is a 48 y.o. female.   Presenting today with 4-day history of cough, sore throat, congestion, ear pain.  Denies fever, chills, chest pain, shortness of breath, abdominal pain, vomiting, diarrhea.  So far tried Profen and Tylenol with minimal relief.  Multiple sick contacts recently.    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      Home Medications    Prior to Admission medications   Medication Sig Start Date End Date Taking? Authorizing Provider  fluticasone (FLONASE) 50 MCG/ACT nasal spray Place 1 spray into both nostrils 2 (two) times daily. 12/17/23  Yes Particia Nearing, PA-C  predniSONE (DELTASONE) 20 MG tablet Take 2 tablets (40 mg total) by mouth daily with breakfast. 12/17/23  Yes Particia Nearing, PA-C  promethazine-dextromethorphan (PROMETHAZINE-DM) 6.25-15 MG/5ML syrup Take 5 mLs by mouth 4 (four) times daily as needed. 12/17/23  Yes Particia Nearing, PA-C  cephALEXin (KEFLEX) 500 MG capsule  Take 1 capsule (500 mg total) by mouth 4 (four) times daily. For 7 days 06/12/18   Pauline Aus, PA-C  cetirizine (ZYRTEC) 10 MG tablet Take 1 tablet (10 mg total) by mouth daily. 09/14/22   Leath-Warren, Sadie Haber, NP  fluticasone (FLONASE) 50 MCG/ACT nasal spray Place 2 sprays into both nostrils daily. 09/14/22   Leath-Warren, Sadie Haber, NP  fluticasone (FLONASE) 50 MCG/ACT nasal spray Place 1 spray into both nostrils 2 (two) times daily. 11/25/22   Particia Nearing, PA-C  meclizine (ANTIVERT) 25 MG tablet Take 1 tablet (25 mg total) by mouth 3 (three) times daily as needed for dizziness. 06/12/18   Triplett, Tammy, PA-C  olopatadine (PATANOL) 0.1 % ophthalmic solution Place 1 drop into both eyes 2 (two) times daily. 09/14/22   Leath-Warren, Sadie Haber, NP  predniSONE (DELTASONE) 20 MG tablet Take 2 tablets (40 mg total) by mouth daily with breakfast. 11/07/21   Particia Nearing, PA-C  promethazine-dextromethorphan (PROMETHAZINE-DM) 6.25-15 MG/5ML syrup Take 5 mLs by mouth 4 (four) times daily as needed. 11/25/22   Particia Nearing, PA-C    Family History Family History  Problem Relation Age of Onset   Colon cancer Neg Hx     Social History Social History   Tobacco Use   Smoking status: Never   Smokeless tobacco: Never  Vaping Use   Vaping status: Never Used  Substance Use Topics   Alcohol use:  No   Drug use: Never     Allergies   Patient has no known allergies.   Review of Systems Review of Systems Per HPI  Physical Exam Triage Vital Signs ED Triage Vitals  Encounter Vitals Group     BP 12/17/23 1733 125/85     Systolic BP Percentile --      Diastolic BP Percentile --      Pulse Rate 12/17/23 1733 (!) 59     Resp 12/17/23 1733 16     Temp 12/17/23 1733 97.9 F (36.6 C)     Temp Source 12/17/23 1733 Oral     SpO2 12/17/23 1733 97 %     Weight --      Height --      Head Circumference --      Peak Flow --      Pain Score 12/17/23 1734 8      Pain Loc --      Pain Education --      Exclude from Growth Chart --    No data found.  Updated Vital Signs BP 125/85 (BP Location: Right Arm)   Pulse (!) 59   Temp 97.9 F (36.6 C) (Oral)   Resp 16   LMP 11/14/2023 (Approximate)   SpO2 97%   Visual Acuity Right Eye Distance:   Left Eye Distance:   Bilateral Distance:    Right Eye Near:   Left Eye Near:    Bilateral Near:     Physical Exam Vitals and nursing note reviewed.  Constitutional:      Appearance: Normal appearance.  HENT:     Head: Atraumatic.     Right Ear: Tympanic membrane and external ear normal.     Left Ear: Tympanic membrane and external ear normal.     Nose: Rhinorrhea present.     Mouth/Throat:     Mouth: Mucous membranes are moist.     Pharynx: Posterior oropharyngeal erythema present.  Eyes:     Extraocular Movements: Extraocular movements intact.     Conjunctiva/sclera: Conjunctivae normal.  Cardiovascular:     Rate and Rhythm: Normal rate and regular rhythm.     Heart sounds: Normal heart sounds.  Pulmonary:     Effort: Pulmonary effort is normal.     Breath sounds: Normal breath sounds. No wheezing.  Musculoskeletal:        General: Normal range of motion.     Cervical back: Normal range of motion and neck supple.  Skin:    General: Skin is warm and dry.  Neurological:     Mental Status: She is alert and oriented to person, place, and time.  Psychiatric:        Mood and Affect: Mood normal.        Thought Content: Thought content normal.      UC Treatments / Results  Labs (all labs ordered are listed, but only abnormal results are displayed) Labs Reviewed  POC COVID19/FLU A&B COMBO  POCT RAPID STREP A (OFFICE)    EKG   Radiology No results found.  Procedures Procedures (including critical care time)  Medications Ordered in UC Medications - No data to display  Initial Impression / Assessment and Plan / UC Course  I have reviewed the triage vital signs and the  nursing notes.  Pertinent labs & imaging results that were available during my care of the patient were reviewed by me and considered in my medical decision making (see chart for details).  Rapid flu and COVID-negative, suspect viral bronchitis.  Treat with prednisone, Phenergan DM, Flonase, supportive over-the-counter medications and home care.  Return for worsening symptoms.  Final Clinical Impressions(s) / UC Diagnoses   Final diagnoses:  Viral bronchitis   Discharge Instructions   None    ED Prescriptions     Medication Sig Dispense Auth. Provider   predniSONE (DELTASONE) 20 MG tablet Take 2 tablets (40 mg total) by mouth daily with breakfast. 10 tablet Particia Nearing, PA-C   promethazine-dextromethorphan (PROMETHAZINE-DM) 6.25-15 MG/5ML syrup Take 5 mLs by mouth 4 (four) times daily as needed. 100 mL Particia Nearing, PA-C   fluticasone [facility]) 50 MCG/ACT nasal spray Place 1 spray into both nostrils 2 (two) times daily. 16 g Particia Nearing, [place]      PDMP not reviewed this encounter.   Particia Nearing, [place] 12/21/23 1313

## 2024-11-06 ENCOUNTER — Ambulatory Visit
Admission: EM | Admit: 2024-11-06 | Discharge: 2024-11-06 | Disposition: A | Discharge status: Home / Self Care | Payer: Self-pay | Attending: Nurse Practitioner | Admitting: Nurse Practitioner

## 2024-11-06 DIAGNOSIS — J069 Acute upper respiratory infection, unspecified: Secondary | ICD-10-CM

## 2024-11-06 DIAGNOSIS — J029 Acute pharyngitis, unspecified: Secondary | ICD-10-CM

## 2024-11-06 LAB — POC COVID19/FLU A&B COMBO
Covid Antigen, POC: NEGATIVE
Influenza A Antigen, POC: NEGATIVE
Influenza B Antigen, POC: NEGATIVE

## 2024-11-06 LAB — POCT RAPID STREP A (OFFICE): Rapid Strep A Screen: NEGATIVE

## 2024-11-06 MED ORDER — LIDOCAINE VISCOUS HCL 2 % MT SOLN: OROMUCOSAL | 0 refills | Status: AC

## 2024-11-06 MED ORDER — FLUTICASONE PROPIONATE 50 MCG/ACT NA SUSP: 2.0000 | Freq: Every day | NASAL | 0 refills | Status: AC

## 2024-11-06 MED ORDER — PROMETHAZINE-DM 6.25-15 MG/5ML PO SYRP: 5.0000 mL | ORAL_SOLUTION | Freq: Four times a day (QID) | ORAL | 0 refills | Status: AC | PRN

## 2024-11-06 NOTE — ED Triage Notes (Signed)
 Per son pt report sore throat headache, body ache, cough and congestion started Monday.

## 2024-11-06 NOTE — Discharge Instructions (Addendum)
 The COVID/flu test and rapid strep test were negative.  A throat culture has been ordered.  You will be contacted if the pending test result is abnormal.  You will also have access to the results via MyChart. Take medication as prescribed. Increase fluids and allow for plenty of rest. You may take over-the-counter Tylenol or ibuprofen as needed for pain, fever, or general discomfort. Recommend warm salt water  gargles 3-4 times daily as needed for throat pain or discomfort.  You may also use over-the-counter Chloraseptic throat spray or throat lozenges. For the cough, recommend use of a humidifier in your bedroom at nighttime during sleep and sleeping elevated on pillows while symptoms persist. Symptoms should begin to improve over the next 5 to 7 days.  If symptoms fail to improve, or begin to worsen, you may follow-up in this clinic or with your primary care physician for further evaluation. Follow-up as needed.

## 2024-11-06 NOTE — ED Notes (Signed)
 Pt declined spanish interpreter during triage has requested that son interpret for her

## 2024-11-06 NOTE — ED Provider Notes (Signed)
 " [facility]    CSN: [id number] Arrival date & time: 11/06/24  1109      History   Chief Complaint No chief complaint on file.   HPI Gabriella Garcia is a 49 y.o. female.   The history is provided by the patient. No language interpreter was used (patient declined interpretation services, interpretation provided by the patient's son).   Patient presents with a 3-day history of fever, headache, sore throat, body aches, cough, and nasal congestion.  Tmax 102.  Denies ear pain, ear drainage, wheezing, difficulty breathing, abdominal pain, nausea, vomiting, diarrhea, or rash.  Patient denies any obvious close sick contacts.  States she has been taking over-the-counter analgesics for her symptoms.  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   Abdominal pain, chronic, epigastric 03/28/2013    Past Surgical History:  Procedure Laterality Date   CESAREAN SECTION     x2   COLONOSCOPY N/A 06/03/2013   Procedure: COLONOSCOPY;  Surgeon: Margo LITTIE Haddock, MD;  Location: [facility];  Service: Endoscopy;  Laterality: N/A;  9:45   ESOPHAGOGASTRODUODENOSCOPY N/A 04/11/2013   DOQ:[id number] PAIN DUE TO gastritis & DUODENITIS   GIVENS CAPSULE STUDY N/A 06/05/2013   Procedure: GIVENS CAPSULE STUDY;  Surgeon: Margo LITTIE Haddock, MD;  Location: [facility];  Service: Endoscopy;  Laterality: N/A;  7:30    OB History   No obstetric history on file.      Home Medications    Prior to Admission medications  Medication Sig Start Date End Date Taking? Authorizing Provider  fluticasone  (FLONASE ) 50 MCG/ACT nasal spray Place 2 sprays into both nostrils daily. 11/06/24  Yes Leath-Warren, Etta PARAS, NP  lidocaine  (XYLOCAINE ) 2 % solution Gargle and spit 5 mL every 6 hours as needed for throat pain or discomfort. 11/06/24  Yes  Leath-Warren, Etta PARAS, NP  promethazine -dextromethorphan (PROMETHAZINE -DM) 6.25-15 MG/5ML syrup Take 5 mLs by mouth 4 (four) times daily as needed. 11/06/24  Yes Leath-Warren, Etta PARAS, NP  cephALEXin  (KEFLEX ) 500 MG capsule Take 1 capsule (500 mg total) by mouth 4 (four) times daily. For 7 days 06/12/18   Triplett, Tammy, PA-C  cetirizine  (ZYRTEC ) 10 MG tablet Take 1 tablet (10 mg total) by mouth daily. 09/14/22   Leath-Warren, Etta PARAS, NP  meclizine  (ANTIVERT ) 25 MG tablet Take 1 tablet (25 mg total) by mouth 3 (three) times daily as needed for dizziness. 06/12/18   Triplett, Tammy, PA-C  olopatadine  (PATANOL) 0.1 % ophthalmic solution Place 1 drop into both eyes 2 (two) times daily. 09/14/22   Leath-Warren, Etta PARAS, NP  predniSONE  (DELTASONE ) 20 MG tablet Take 2 tablets (40 mg total) by mouth daily with breakfast. 11/07/21   Stuart Vernell Norris, PA-C  predniSONE  (DELTASONE ) 20 MG tablet Take 2 tablets (40 mg total) by mouth daily with breakfast. 12/17/23   Stuart Vernell Norris, PA-C    Family History Family History  Problem Relation Age of Onset   Colon cancer Neg Hx     Social History Social History[1]   Allergies   Patient has no known allergies.   Review of Systems Review of Systems Per HPI  Physical Exam Triage Vital Signs ED Triage Vitals  Encounter Vitals Group     BP 11/06/24 1138 139/89     Girls Systolic  BP Percentile --      Girls Diastolic BP Percentile --      Boys Systolic BP Percentile --      Boys Diastolic BP Percentile --      Pulse Rate 11/06/24 1138 88     Resp 11/06/24 1138 20     Temp 11/06/24 1138 98.9 F (37.2 C)     Temp Source 11/06/24 1138 Oral     SpO2 11/06/24 1138 97 %     Weight --      Height --      Head Circumference --      Peak Flow --      Pain Score 11/06/24 1141 8     Pain Loc --      Pain Education --      Exclude from Growth Chart --    No data found.  Updated Vital Signs BP 139/89 (BP Location: Right Arm)    Pulse 88   Temp 98.9 F (37.2 C) (Oral)   Resp 20   LMP  (LMP Unknown)   SpO2 97%   Visual Acuity Right Eye Distance:   Left Eye Distance:   Bilateral Distance:    Right Eye Near:   Left Eye Near:    Bilateral Near:     Physical Exam Vitals and nursing note reviewed.  Constitutional:      General: She is not in acute distress.    Appearance: Normal appearance.  HENT:     Head: Normocephalic.     Right Ear: Tympanic membrane, ear canal and external ear normal.     Left Ear: Tympanic membrane, ear canal and external ear normal.     Nose: Congestion present.     Right Turbinates: Enlarged and swollen.     Left Turbinates: Enlarged and swollen.     Right Sinus: No maxillary sinus tenderness or frontal sinus tenderness.     Left Sinus: No maxillary sinus tenderness or frontal sinus tenderness.     Mouth/Throat:     Lips: Pink.     Mouth: Mucous membranes are moist.     Pharynx: Uvula midline. Pharyngeal swelling, posterior oropharyngeal erythema and postnasal drip present. No oropharyngeal exudate.     Tonsils: 1+ on the right. 1+ on the left.  Eyes:     Extraocular Movements: Extraocular movements intact.     Conjunctiva/sclera: Conjunctivae normal.     Pupils: Pupils are equal, round, and reactive to light.  Cardiovascular:     Rate and Rhythm: Normal rate and regular rhythm.     Pulses: Normal pulses.     Heart sounds: Normal heart sounds.  Pulmonary:     Effort: Pulmonary effort is normal.     Breath sounds: Normal breath sounds.  Abdominal:     General: Bowel sounds are normal.     Palpations: Abdomen is soft.  Musculoskeletal:     Cervical back: Normal range of motion.  Skin:    General: Skin is warm and dry.  Neurological:     General: No focal deficit present.     Mental Status: She is alert and oriented to person, place, and time.  Psychiatric:        Mood and Affect: Mood normal.        Behavior: Behavior normal.      UC Treatments / Results   Labs (all labs ordered are listed, but only abnormal results are displayed) Labs Reviewed  POC COVID19/FLU A&B COMBO - Normal  POCT RAPID  STREP A (OFFICE) - Normal    EKG   Radiology No results found.  Procedures Procedures (including critical care time)  Medications Ordered in UC Medications - No data to display  Initial Impression / Assessment and Plan / UC Course  I have reviewed the triage vital signs and the nursing notes.  Pertinent labs & imaging results that were available during my care of the patient were reviewed by me and considered in my medical decision making (see chart for details).  COVID/flu test and rapid strep test were negative.  Throat culture is pending.  On exam, the patient is well-appearing, she is in no acute distress, vital signs are stable.  Symptoms consistent with viral etiology.  Will provide symptomatic treatment with viscous lidocaine , azelastine nasal spray, and Promethazine  DM.  Supportive care recommendations were provided and discussed with the patient to include fluids, rest, over-the-counter analgesics, warm salt water  gargles/Chloraseptic throat spray, and use of a humidifier during sleep.  Discussed indications with patient regarding follow-up.  Patient was in agreement with this plan of care and verbalizes understanding.  All questions were answered.  Patient stable for discharge.  Work note was provided.  Final Clinical Impressions(s) / UC Diagnoses   Final diagnoses:  Viral URI with cough  Sore throat     Discharge Instructions      The COVID/flu test and rapid strep test were negative.  A throat culture has been ordered.  You will be contacted if the pending test result is abnormal.  You will also have access to the results via MyChart. Take medication as prescribed. Increase fluids and allow for plenty of rest. You may take over-the-counter Tylenol or ibuprofen as needed for pain, fever, or general discomfort. Recommend warm  salt water  gargles 3-4 times daily as needed for throat pain or discomfort.  You may also use over-the-counter Chloraseptic throat spray or throat lozenges. For the cough, recommend use of a humidifier in your bedroom at nighttime during sleep and sleeping elevated on pillows while symptoms persist. Symptoms should begin to improve over the next 5 to 7 days.  If symptoms fail to improve, or begin to worsen, you may follow-up in this clinic or with your primary care physician for further evaluation. Follow-up as needed.     ED Prescriptions     Medication Sig Dispense Auth. Provider   lidocaine  (XYLOCAINE ) 2 % solution Gargle and spit 5 mL every 6 hours as needed for throat pain or discomfort. 100 mL Leath-Warren, Etta PARAS, NP   fluticasone  (FLONASE ) 50 MCG/ACT nasal spray Place 2 sprays into both nostrils daily. 16 g Leath-Warren, Etta PARAS, NP   promethazine -dextromethorphan (PROMETHAZINE -DM) 6.25-15 MG/5ML syrup Take 5 mLs by mouth 4 (four) times daily as needed. 118 mL Leath-Warren, Etta PARAS, NP      PDMP not reviewed this encounter.     [1]  Social History Tobacco Use   Smoking status: Never   Smokeless tobacco: Never  Vaping Use   Vaping status: Never Used  Substance Use Topics   Alcohol use: No   Drug use: Never     Gilmer Etta PARAS, NP 11/06/24 1214  "
